# Patient Record
Sex: Female | Born: 1965 | Race: Black or African American | Hispanic: No | Marital: Married | Smoking: Never smoker
Health system: Southern US, Community
[De-identification: ages and names within clinical notes are randomized; demographics above are authoritative.]

## PROBLEM LIST (undated history)

## (undated) DIAGNOSIS — I10 Essential (primary) hypertension: Secondary | ICD-10-CM

## (undated) DIAGNOSIS — E785 Hyperlipidemia, unspecified: Secondary | ICD-10-CM

## (undated) DIAGNOSIS — R7989 Other specified abnormal findings of blood chemistry: Secondary | ICD-10-CM

## (undated) DIAGNOSIS — E039 Hypothyroidism, unspecified: Secondary | ICD-10-CM

## (undated) DIAGNOSIS — N951 Menopausal and female climacteric states: Secondary | ICD-10-CM

## (undated) DIAGNOSIS — E049 Nontoxic goiter, unspecified: Secondary | ICD-10-CM

## (undated) HISTORY — DX: Menopausal and female climacteric states: N95.1

## (undated) HISTORY — PX: OOPHORECTOMY: SHX86

## (undated) HISTORY — DX: Essential (primary) hypertension: I10

## (undated) HISTORY — DX: Hyperlipidemia, unspecified: E78.5

## (undated) HISTORY — DX: Nontoxic goiter, unspecified: E04.9

## (undated) HISTORY — PX: ABDOMINAL HYSTERECTOMY: SHX81

## (undated) HISTORY — DX: Other specified abnormal findings of blood chemistry: R79.89

## (undated) HISTORY — DX: Hypothyroidism, unspecified: E03.9

---

## 1999-07-06 ENCOUNTER — Ambulatory Visit (HOSPITAL_COMMUNITY): Admission: RE | Admit: 1999-07-06 | Discharge: 1999-07-06 | Payer: Self-pay | Admitting: Obstetrics and Gynecology

## 1999-07-06 ENCOUNTER — Encounter: Payer: Self-pay | Admitting: Obstetrics and Gynecology

## 1999-08-16 ENCOUNTER — Other Ambulatory Visit: Admission: RE | Admit: 1999-08-16 | Discharge: 1999-08-16 | Payer: Self-pay | Admitting: Obstetrics and Gynecology

## 1999-09-27 ENCOUNTER — Inpatient Hospital Stay (HOSPITAL_COMMUNITY): Admission: RE | Admit: 1999-09-27 | Discharge: 1999-09-30 | Payer: Self-pay | Admitting: Obstetrics and Gynecology

## 2003-09-06 ENCOUNTER — Encounter: Admission: RE | Admit: 2003-09-06 | Discharge: 2003-09-06 | Payer: Self-pay | Admitting: Internal Medicine

## 2005-07-15 ENCOUNTER — Encounter: Admission: RE | Admit: 2005-07-15 | Discharge: 2005-07-15 | Payer: Self-pay | Admitting: Internal Medicine

## 2005-08-06 ENCOUNTER — Encounter: Admission: RE | Admit: 2005-08-06 | Discharge: 2005-08-06 | Payer: Self-pay | Admitting: Internal Medicine

## 2006-03-31 ENCOUNTER — Encounter: Admission: RE | Admit: 2006-03-31 | Discharge: 2006-03-31 | Payer: Self-pay | Admitting: Internal Medicine

## 2010-05-14 ENCOUNTER — Ambulatory Visit: Payer: Self-pay | Admitting: Internal Medicine

## 2010-05-14 DIAGNOSIS — E049 Nontoxic goiter, unspecified: Secondary | ICD-10-CM | POA: Insufficient documentation

## 2010-05-14 DIAGNOSIS — I10 Essential (primary) hypertension: Secondary | ICD-10-CM

## 2010-05-14 HISTORY — DX: Essential (primary) hypertension: I10

## 2010-05-14 HISTORY — DX: Nontoxic goiter, unspecified: E04.9

## 2010-05-15 LAB — CONVERTED CEMR LAB
ALT: 12 units/L (ref 0–35)
AST: 20 units/L (ref 0–37)
Albumin: 3.9 g/dL (ref 3.5–5.2)
BUN: 11 mg/dL (ref 6–23)
Basophils Relative: 0.5 % (ref 0.0–3.0)
Chloride: 102 meq/L (ref 96–112)
Cholesterol: 207 mg/dL — ABNORMAL HIGH (ref 0–200)
Direct LDL: 159.6 mg/dL
Eosinophils Relative: 2.4 % (ref 0.0–5.0)
Folate: 7.4 ng/mL
GFR calc non Af Amer: 102.43 mL/min (ref >60)
HCT: 34.4 % — ABNORMAL LOW (ref 36.0–46.0)
Hemoglobin: 11.4 g/dL — ABNORMAL LOW (ref 12.0–15.0)
Ketones, ur: NEGATIVE mg/dL
Lymphs Abs: 3 10*3/uL (ref 0.7–4.0)
MCV: 83.7 fL (ref 78.0–100.0)
Monocytes Absolute: 0.6 10*3/uL (ref 0.1–1.0)
Monocytes Relative: 10.2 % (ref 3.0–12.0)
Neutro Abs: 2.4 10*3/uL (ref 1.4–7.7)
Platelets: 259 10*3/uL (ref 150.0–400.0)
Potassium: 3.9 meq/L (ref 3.5–5.1)
RBC: 4.11 M/uL (ref 3.87–5.11)
Sodium: 140 meq/L (ref 135–145)
Specific Gravity, Urine: 1.025 (ref 1.000–1.030)
Total Bilirubin: 0.5 mg/dL (ref 0.3–1.2)
Total CHOL/HDL Ratio: 5
Total Protein: 8.1 g/dL (ref 6.0–8.3)
Transferrin: 300.6 mg/dL (ref 212.0–360.0)
Triglycerides: 88 mg/dL (ref 0.0–149.0)
Urine Glucose: NEGATIVE mg/dL
Urobilinogen, UA: 0.2 (ref 0.0–1.0)
Vitamin B-12: 700 pg/mL (ref 211–911)
WBC: 6.1 10*3/uL (ref 4.5–10.5)
pH: 7 (ref 5.0–8.0)

## 2010-05-18 ENCOUNTER — Encounter: Admission: RE | Admit: 2010-05-18 | Discharge: 2010-05-18 | Payer: Self-pay | Admitting: Internal Medicine

## 2010-05-28 ENCOUNTER — Encounter: Admission: RE | Admit: 2010-05-28 | Discharge: 2010-05-28 | Payer: Self-pay | Admitting: Internal Medicine

## 2010-06-25 ENCOUNTER — Encounter (INDEPENDENT_AMBULATORY_CARE_PROVIDER_SITE_OTHER): Payer: Self-pay | Admitting: *Deleted

## 2010-06-25 ENCOUNTER — Ambulatory Visit: Payer: Self-pay | Admitting: Internal Medicine

## 2010-06-25 DIAGNOSIS — E785 Hyperlipidemia, unspecified: Secondary | ICD-10-CM

## 2010-06-25 DIAGNOSIS — N951 Menopausal and female climacteric states: Secondary | ICD-10-CM

## 2010-06-25 HISTORY — DX: Hyperlipidemia, unspecified: E78.5

## 2010-06-25 HISTORY — DX: Menopausal and female climacteric states: N95.1

## 2010-09-24 ENCOUNTER — Ambulatory Visit: Admit: 2010-09-24 | Payer: Self-pay | Admitting: Internal Medicine

## 2010-09-25 NOTE — Assessment & Plan Note (Signed)
Summary: NEW BCBS PT--# PKG/OFF--STC   Vital Signs:  Patient profile:   45 year old female Height:      69 inches Weight:      236.25 pounds BMI:     35.01 O2 Sat:      99 % on Room air Temp:     97.8 degrees F oral Pulse rate:   65 / minute BP sitting:   170 / 104  (left arm) Cuff size:   large  Vitals Entered By: Zella Ball Ewing CMA Duncan Dull) (26-May-2010 9:44 AM)  O2 Flow:  Room air  Preventive Care Screening     declines tetanus  CC: New Patient , BCBS/RE   CC:  New Patient  and BCBS/RE.  History of Present Illness: here to establish for wellness and co-morbids';  out of BP meds for approx 1 yr, but when tkaing BP was still elevated > 140; Pt denies CP, worsening sob, doe, wheezing, orthopnea, pnd, worsening LE edema, palps, dizziness or syncope  Pt denies new neuro symptoms such as headache, facial or extremity weakness  No fever, wt loss, night sweats, loss of appetite or other constitutional symptoms  Denies polydipsia or polyuria, but about one wk ago did have episode of urinary freq ever 2 rs with relatively large amounts.    Preventive Screening-Counseling & Management  Alcohol-Tobacco     Smoking Status: never      Drug Use:  no.    Problems Prior to Update: None  Medications Prior to Update: 1)  None  Current Medications (verified): 1)  Norvasc 5 Mg Tabs (Amlodipine Besylate) .Marland Kitchen.. 1po Once Daily 2)  Azor 5-20 Mg Tabs (Amlodipine-Olmesartan) .Marland Kitchen.. 1 By Mouth Once Daily  Allergies (verified): No Known Drug Allergies  Past History:  Family History: Last updated: May 26, 2010 mother died with stoke, HTN at 3yo father with ? HTN m-grandmother with ovary cancer m-aunt with breast cancer in her 59's  Social History: Last updated: 26-May-2010 work - [place] for [employer] Married 2 children Never Smoked Alcohol use-yes - social, rare Drug use-no  Risk Factors: Smoking Status: never (2010-05-26)  Past Medical  History: Hypertension  Past Surgical History: Hysterectomy c-section x 2 Oophorectomy - unilateral, left she thinks  Family History: Reviewed history and no changes required. mother died with stoke, HTN at 46yo father with ? HTN m-grandmother with ovary cancer m-aunt with breast cancer in her 14's  Social History: Reviewed history and no changes required. work - [place] for [employer] Married 2 children Never Smoked Alcohol use-yes - social, rare Drug use-no Smoking Status:  never Drug Use:  no  Review of Systems  The patient denies anorexia, fever, weight loss, weight gain, vision loss, decreased hearing, hoarseness, chest pain, syncope, dyspnea on exertion, peripheral edema, prolonged cough, headaches, hemoptysis, abdominal pain, melena, hematochezia, severe indigestion/heartburn, hematuria, muscle weakness, suspicious skin lesions, transient blindness, difficulty walking, depression, unusual weight change, abnormal bleeding, enlarged lymph nodes, and angioedema.         all otherwise negative per pt -    Physical Exam  General:  alert and overweight-appearing.   Head:  normocephalic and atraumatic.   Eyes:  vision grossly intact, pupils equal, and pupils round.   Ears:  R ear normal and L ear normal.   Nose:  no external deformity and no nasal discharge.   Mouth:  no gingival abnormalities and pharynx pink and moist.   Neck:  supple.  with goiter and right  thyroid nodule noted Lungs:  Normal respiratory effort, chest expands symmetrically. Lungs are clear to auscultation, no crackles or wheezes. Heart:  Normal rate and regular rhythm. S1 and S2 normal without gallop, murmur, click, rub or other extra sounds. Abdomen:  Bowel sounds positive,abdomen soft and non-tender without masses, organomegaly or hernias noted. Msk:  No deformity or scoliosis noted of thoracic or lumbar spine.   Extremities:  No clubbing, cyanosis, edema, or deformity noted with normal  full range of motion of all joints.   Neurologic:  No cranial nerve deficits noted. Station and gait are normal. Plantar reflexes are down-going bilaterally. DTRs are symmetrical throughout. Sensory, motor and coordinative functions appear intact. Skin:  color normal and no rashes.   Psych:  not anxious appearing and not depressed appearing.     Impression & Recommendations:  Problem # 1:  Preventive Health Care (ICD-V70.0)  Overall doing well, age appropriate education and counseling updated and referral for appropriate preventive services done unless declined, immunizations up to date or declined, diet counseling done if overweight, urged to quit smoking if smokes , most recent labs reviewed and current ordered if appropriate, ecg reviewed or declined (interpretation per ECG scanned in the EMR if done); information regarding Medicare Prevention requirements given if appropriate; speciality referrals updated as appropriate   Orders: TLB-BMP (Basic Metabolic Panel-BMET) (80048-METABOL) TLB-CBC Platelet - w/Differential (85025-CBCD) TLB-Hepatic/Liver Function Pnl (80076-HEPATIC) TLB-Lipid Panel (80061-LIPID) TLB-TSH (Thyroid Stimulating Hormone) (84443-TSH) TLB-Udip ONLY (81003-UDIP)  Problem # 2:  HYPERTENSION (ICD-401.9)  Her updated medication list for this problem includes:    Norvasc 5 Mg Tabs (Amlodipine besylate) .Marland Kitchen... 1po once daily    Azor 5-20 Mg Tabs (Amlodipine-olmesartan) .Marland Kitchen... 1 by mouth once daily to change to azor, higher strength, f/u 6 wks  BP today: 170/104  Problem # 3:  THYROMEGALY (ICD-240.9) for thyroid u/s - r/o nodule > 1 cm Orders: Radiology Referral (Radiology)  Complete Medication List: 1)  Norvasc 5 Mg Tabs (Amlodipine besylate) .Marland Kitchen.. 1po once daily 2)  Azor 5-20 Mg Tabs (Amlodipine-olmesartan) .Marland Kitchen.. 1 by mouth once daily  Other Orders: Admin 1st Vaccine ([address]) Flu Vaccine 45yrs + [phone])  Patient Instructions: 1)  we will send off for your  previous records 2)  plesae call for your yearly mammogra -  consider [facility] on [address], or [facility] on [address] 3)  Please go to the Lab in the basement for your blood and/or urine tests today  4)  You will be contacted about the referral(s) to: thyroid ultrasound 5)  Please call the number on the [facility] Card for results of your testing  6)  Please schedule a follow-up appointment in 6 weeks Prescriptions: AZOR 5-20 MG TABS (AMLODIPINE-OLMESARTAN) 1 by mouth once daily  #90 x 3   Entered and Authorized by:   Corwin Levins MD   Signed by:   Corwin Levins MD on 05/14/2010   Method used:   Print then Give to Patient   RxID:   [id number]     Flu Vaccine Consent Questions     Do you have a history of severe allergic reactions to this vaccine? no    Any prior history of allergic reactions to egg and/or gelatin? no    Do you have a sensitivity to the preservative Thimersol? no    Do you have a past history of Guillan-Barre Syndrome? no    Do you currently have an acute febrile illness? no    Have you ever had a severe  reaction to latex? no    Vaccine information given and explained to patient? yes    Are you currently pregnant? no    Lot Number:[id number]   Exp Date:02/22/2010   Site Given  Left Deltoid IMlu

## 2010-09-25 NOTE — Letter (Signed)
Summary: Generic Letter  [facility]  [address] [place], [place] [address]   Phone: [phone]  Fax: [phone]    06/25/2010  Stacy Newton [address] [place], [place]  [address]  To Whom it may concern,  Stacy Newton was seen in our office today for an appointment. Please  call our office if you have any questions.           Sincerely,   Dr. Oliver Barre

## 2010-09-25 NOTE — Assessment & Plan Note (Signed)
Summary: 6 WK F/U  #/ CD   Vital Signs:  Patient profile:   45 year old female Height:      69 inches Weight:      244.13 pounds BMI:     36.18 O2 Sat:      97 % on Room air Temp:     98.4 degrees F oral Pulse rate:   82 / minute BP sitting:   162 / 90  (left arm) Cuff size:   large  Vitals Entered By: Zella Ball Ewing CMA (AAMA) (June 25, 2010 9:05 AM)  O2 Flow:  Room air CC: 6 week followup/   CC:  6 week followup/.  History of Present Illness: here for f/u - overall doing ok,  not really trying to follow low chol diet,  good complaince and tolerating meds well;  Pt denies CP, worsening sob, doe, wheezing, orthopnea, pnd, worsening LE edema, palps, dizziness or syncope  Pt denies new neuro symptoms such as headache, facial or extremity weakness   Pt denies polydipsia, polyuria.  Has had ongoing numerous hot flashed in the past 6 months - ? menopausal.  No flushung and No fever, wt loss, night sweats, loss of appetite or other constitutional symptoms.  Denies worsening depressive symptoms, suicidal ideation, or panic.    Problems Prior to Update: 1)  Hot Flashes  (ICD-627.2) 2)  Hyperlipidemia  (ICD-272.4) 3)  Thyromegaly  (ICD-240.9) 4)  Preventive Health Care  (ICD-V70.0) 5)  Hypertension  (ICD-401.9)  Medications Prior to Update: 1)  Norvasc 5 Mg Tabs (Amlodipine Besylate) .Marland Kitchen.. 1po Once Daily 2)  Azor 5-20 Mg Tabs (Amlodipine-Olmesartan) .Marland Kitchen.. 1 By Mouth Once Daily  Current Medications (verified): 1)  Azor 5-40 Mg Tabs (Amlodipine-Olmesartan) .Marland Kitchen.. 1po Once Daily 2)  Aspir-Low 81 Mg Tbec (Aspirin) .Marland Kitchen.. 1po Once Daily  Allergies (verified): No Known Drug Allergies  Past History:  Past Surgical History: Last updated: 05/14/2010 Hysterectomy c-section x 2 Oophorectomy - unilateral, left she thinks  Social History: Last updated: 05/14/2010 work - [place] [employer] for [employer] Married 2 children Never Smoked Alcohol use-yes - social, rare Drug  use-no  Risk Factors: Smoking Status: never (05/14/2010)  Past Medical History: Hypertension Hyperlipidemia  Review of Systems       all otherwise negative per pt -    Physical Exam  General:  alert and overweight-appearing.   Head:  normocephalic and atraumatic.   Eyes:  vision grossly intact, pupils equal, and pupils round.   Ears:  R ear normal and L ear normal.   Nose:  no external deformity and no nasal discharge.   Mouth:  no gingival abnormalities and pharynx pink and moist.   Neck:  supple. no JVD.   Lungs:  Normal respiratory effort, chest expands symmetrically. Lungs are clear to auscultation, no crackles or wheezes. Heart:  Normal rate and regular rhythm. S1 and S2 normal without gallop, murmur, click, rub or other extra sounds. Extremities:  no edema, no erythema    Impression & Recommendations:  Problem # 1:  HYPERTENSION (ICD-401.9)  The following medications were removed from the medication list:    Norvasc 5 Mg Tabs (Amlodipine besylate) .Marland Kitchen... 1po once daily Her updated medication list for this problem includes:    Azor 5-40 Mg Tabs (Amlodipine-olmesartan) .Marland Kitchen... 1po once daily uncontrolled; for increased azor as above, f/u at home and next visit  BP today: 162/90 Prior BP: 170/104 (05/14/2010)  Labs Reviewed: K+: 3.9 (05/14/2010) Creat: : 0.8 (05/14/2010)   Chol:  207 (05/14/2010)   HDL: 40.80 (05/14/2010)   TG: 88.0 (05/14/2010)  Problem # 2:  HYPERLIPIDEMIA (ICD-272.4) Assessment: Unchanged  Labs Reviewed: SGOT: 20 (05/14/2010)   SGPT: 12 (05/14/2010)   HDL:40.80 (05/14/2010)  Chol:207 (05/14/2010)  Trig:88.0 (05/14/2010) d/w pt- .Pt to continue diet efforts, good med tolerance;  - goal LDL less than 100, declines statin at this time  Problem # 3:  HOT FLASHES (ICD-627.2) ? perimenopausal - for [facility] with next lab draw;  if menopausal would be more aggressive with statin tx  Complete Medication List: 1)  Azor 5-40 Mg Tabs (Amlodipine-olmesartan)  .Marland Kitchen.. 1po once daily 2)  Aspir-low 81 Mg Tbec (Aspirin) .Marland Kitchen.. 1po once daily  Patient Instructions: 1)  change the azor to the 5/40 mg - 1 per day 2)  Take an Aspirin every day. - 81 mg - 1 per day - COATED only 3)  Please follow lower cholesterol diet 4)  Please schedule a follow-up appointment in 3 months - Feb 2012 with: 5)  Lipid Panel prior to visit, ICD-9: 272.0 6)  FSH:   627.2 Prescriptions: AZOR 5-40 MG TABS (AMLODIPINE-OLMESARTAN) 1po once daily  #90 x 3   Entered and Authorized by:   Corwin Levins MD   Signed by:   Corwin Levins MD on 06/25/2010   Method used:   Print then Give to Patient   RxID:   [id number]    Orders Added: 1)  Est. Patient Level IV [[address]]

## 2010-10-01 ENCOUNTER — Ambulatory Visit: Payer: Self-pay | Admitting: Internal Medicine

## 2011-07-15 ENCOUNTER — Other Ambulatory Visit: Payer: Self-pay

## 2011-07-15 MED ORDER — AMLODIPINE-OLMESARTAN 5-40 MG PO TABS: 1.0000 | ORAL_TABLET | Freq: Every day | ORAL | Status: DC

## 2011-08-03 IMAGING — US US SOFT TISSUE HEAD/NECK
1 series · 14 of 25 positions shown · non-contrast
Comparison: None.

CLINICAL DATA: Thyroidomegaly on physical exam

THYROID ULTRASOUND
TECHNIQUE: Ultrasound examination of the thyroid gland and adjacent
soft tissues was performed.

[Series 1: us soft tissue head/neck · 0.08mm/px · 14 of 28 slices shown]
[im 1/28]
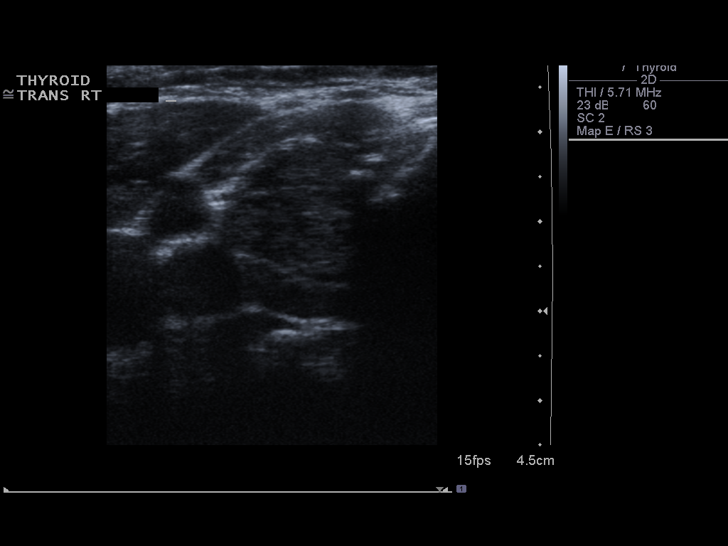
[im 3/28]
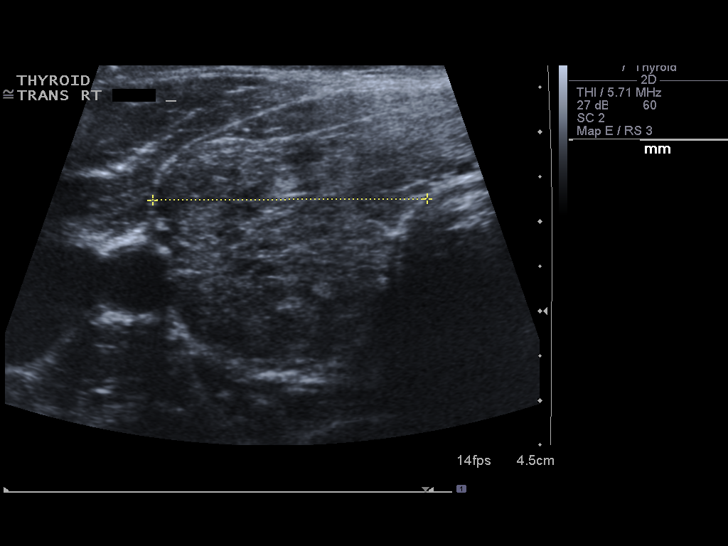
[im 5/28]
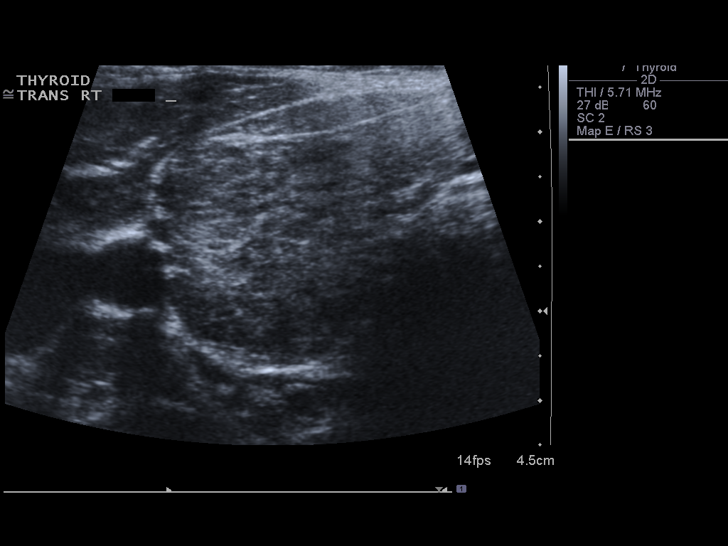
[im 7/28]
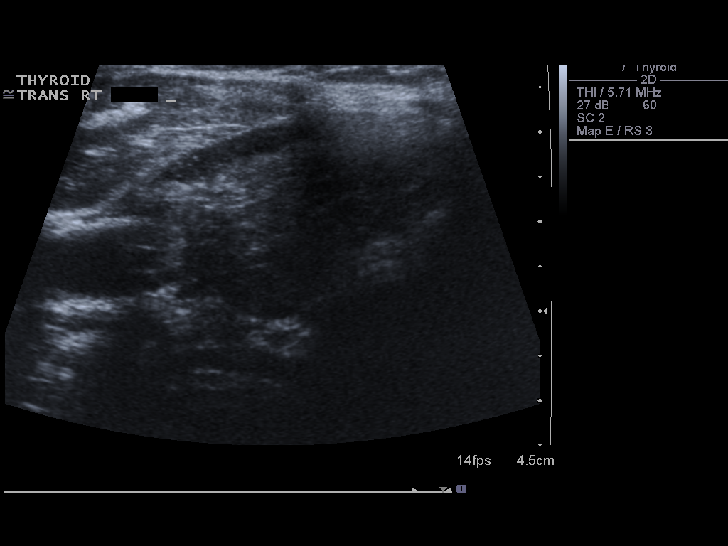
[im 10/28]
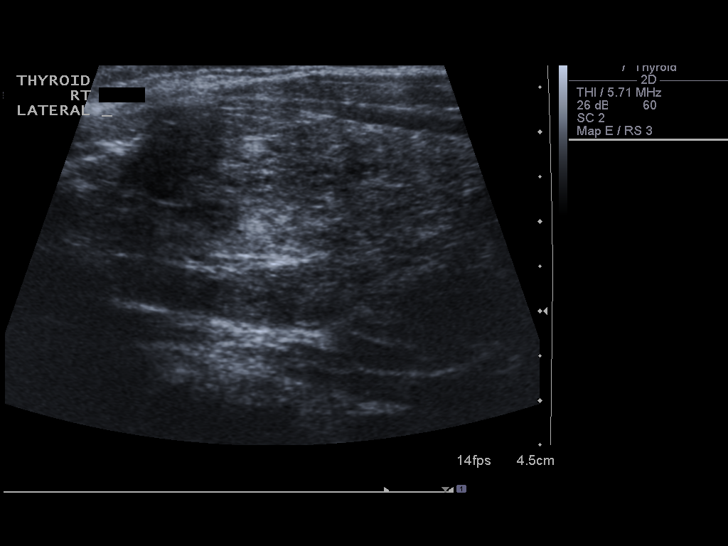
[im 11/28]
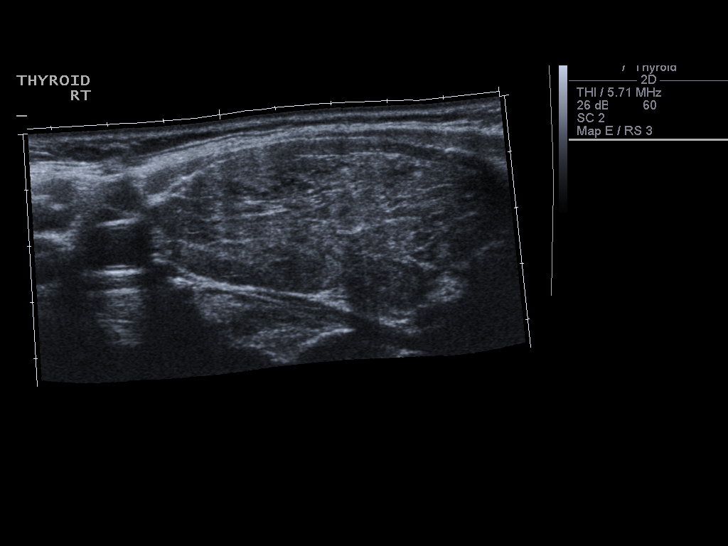
[im 13/28]
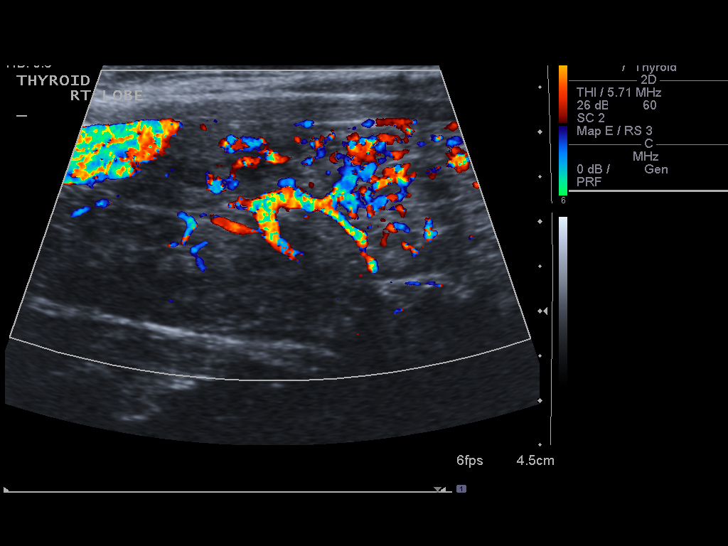
[im 15/28]
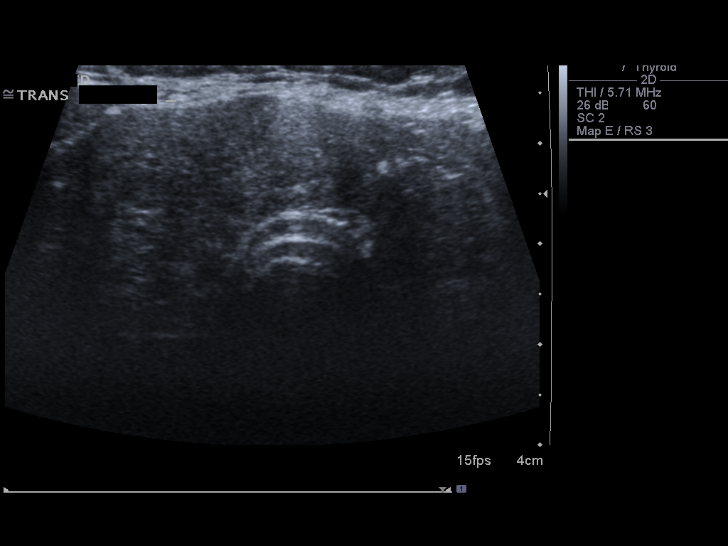
[im 17/28]
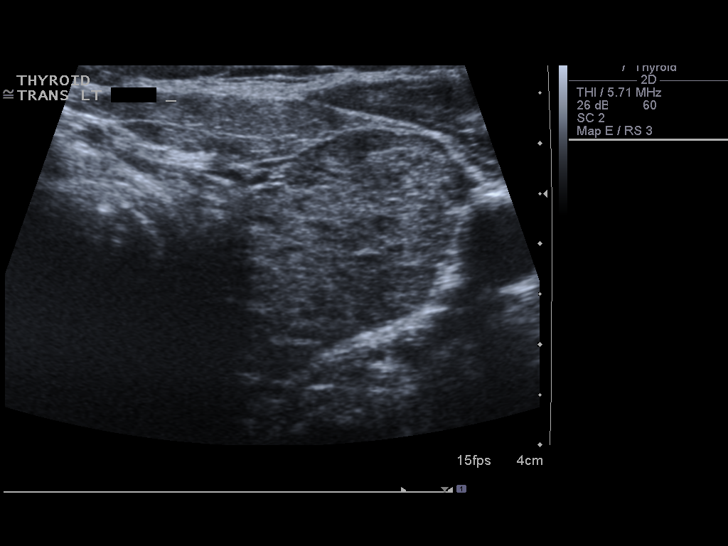
[im 19/28]
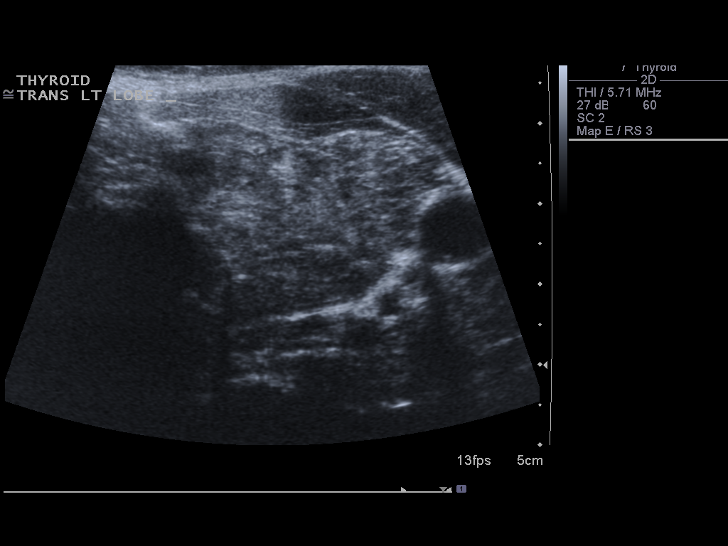
[im 21/28]
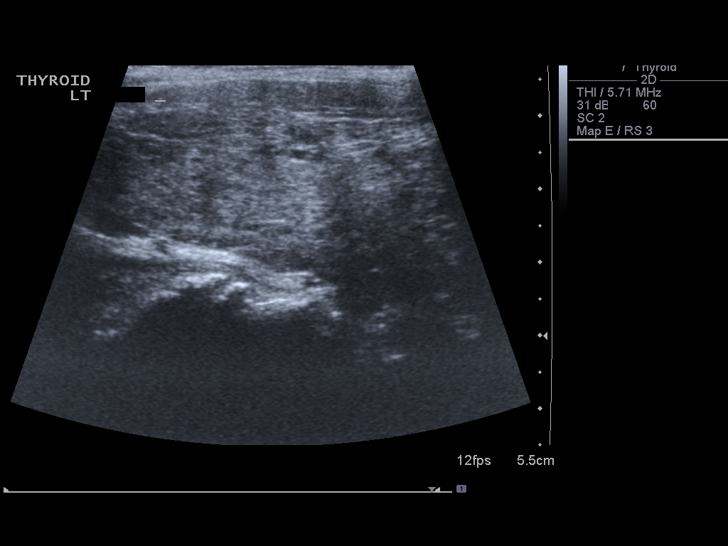
[im 23/28]
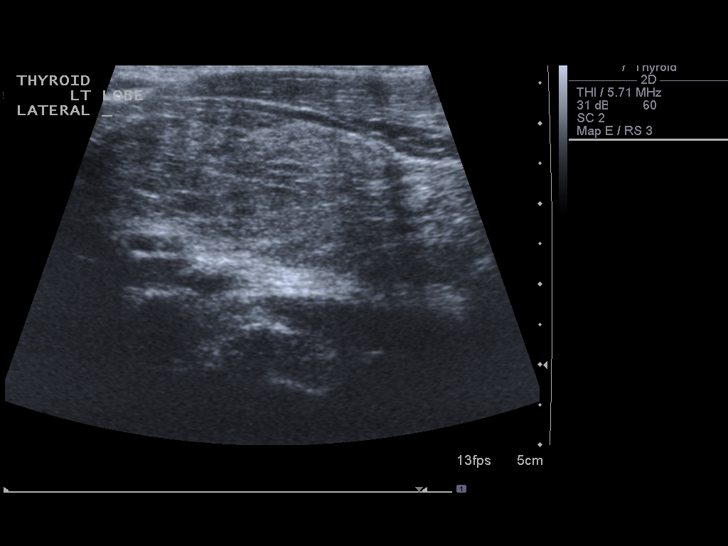
[im 25/28]
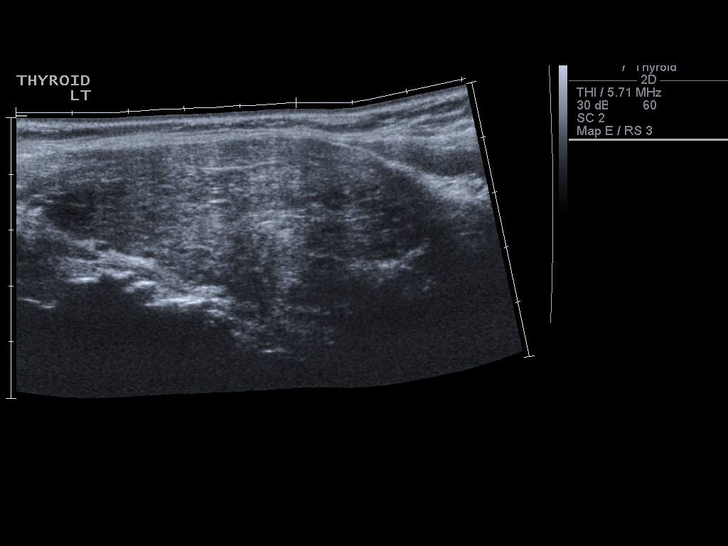
[im 28/28]
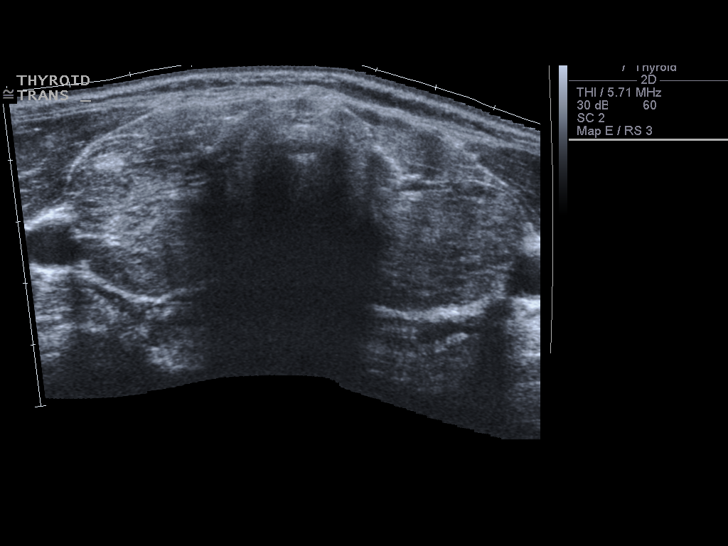

[14 of 25 positions shown; findings below may reference images not displayed]

FINDINGS: Right thyroid lobe:  7.2 x 3.1 x 3.0 cm
Left thyroid lobe:  7.1 x 3.2 x 2.8 cm
Isthmus:  1.1 cm

Focal nodules:  No focal nodule.  The thyroid gland is diffusely
inhomogeneous in echotexture with increased color flow.

Lymphadenopathy:  None visualized.
IMPRESSION: Diffusely enlarged fibroid with increased internal color flow which
may suggest thyroiditis.  No focal nodule.  Correlate with thyroid
function tests.

## 2011-09-03 ENCOUNTER — Telehealth: Payer: Self-pay | Admitting: Internal Medicine

## 2011-09-03 NOTE — Telephone Encounter (Signed)
Last visit oct 2011 - needs OV

## 2011-09-03 NOTE — Telephone Encounter (Signed)
Pt requesting generic blood pressure med/azor 5/40mg --Regular brand too expensive--[facility]--

## 2011-09-04 NOTE — Telephone Encounter (Signed)
Patient informed and did schedule appointment with JWJ.

## 2011-09-06 ENCOUNTER — Ambulatory Visit (INDEPENDENT_AMBULATORY_CARE_PROVIDER_SITE_OTHER): Payer: Federal, State, Local not specified - PPO | Admitting: Internal Medicine

## 2011-09-06 ENCOUNTER — Other Ambulatory Visit (INDEPENDENT_AMBULATORY_CARE_PROVIDER_SITE_OTHER): Payer: Federal, State, Local not specified - PPO

## 2011-09-06 ENCOUNTER — Other Ambulatory Visit: Payer: Self-pay | Admitting: Internal Medicine

## 2011-09-06 VITALS — BP 142/88 | HR 64 | Temp 98.4°F | Wt 236.0 lb

## 2011-09-06 DIAGNOSIS — J019 Acute sinusitis, unspecified: Secondary | ICD-10-CM

## 2011-09-06 DIAGNOSIS — R6889 Other general symptoms and signs: Secondary | ICD-10-CM

## 2011-09-06 DIAGNOSIS — Z0001 Encounter for general adult medical examination with abnormal findings: Secondary | ICD-10-CM | POA: Insufficient documentation

## 2011-09-06 DIAGNOSIS — I10 Essential (primary) hypertension: Secondary | ICD-10-CM

## 2011-09-06 DIAGNOSIS — R7989 Other specified abnormal findings of blood chemistry: Secondary | ICD-10-CM

## 2011-09-06 DIAGNOSIS — Z0000 Encounter for general adult medical examination without abnormal findings: Secondary | ICD-10-CM

## 2011-09-06 DIAGNOSIS — Z23 Encounter for immunization: Secondary | ICD-10-CM

## 2011-09-06 LAB — CBC WITH DIFFERENTIAL/PLATELET
Basophils Absolute: 0 10*3/uL (ref 0.0–0.1)
Eosinophils Absolute: 0.2 10*3/uL (ref 0.0–0.7)
HCT: 34.6 % — ABNORMAL LOW (ref 36.0–46.0)
Hemoglobin: 11.4 g/dL — ABNORMAL LOW (ref 12.0–15.0)
Lymphs Abs: 4 10*3/uL (ref 0.7–4.0)
MCHC: 33 g/dL (ref 30.0–36.0)
MCV: 83.1 fl (ref 78.0–100.0)
Monocytes Absolute: 0.5 10*3/uL (ref 0.1–1.0)
Neutro Abs: 2.6 10*3/uL (ref 1.4–7.7)
RDW: 15.8 % — ABNORMAL HIGH (ref 11.5–14.6)

## 2011-09-06 LAB — URINALYSIS, ROUTINE W REFLEX MICROSCOPIC
Bilirubin Urine: NEGATIVE
Ketones, ur: NEGATIVE
Leukocytes, UA: NEGATIVE
Nitrite: NEGATIVE
Specific Gravity, Urine: >=1.03 (ref 1.000–1.030)
Total Protein, Urine: NEGATIVE
Urine Glucose: NEGATIVE
Urobilinogen, UA: 0.2 (ref 0.0–1.0)
pH: 6 (ref 5.0–8.0)

## 2011-09-06 LAB — HEPATIC FUNCTION PANEL
ALT: 12 U/L (ref 0–35)
AST: 19 U/L (ref 0–37)
Albumin: 3.9 g/dL (ref 3.5–5.2)
Alkaline Phosphatase: 68 U/L (ref 39–117)
Bilirubin, Direct: 0.1 mg/dL (ref 0.0–0.3)
Total Bilirubin: 0.2 mg/dL — ABNORMAL LOW (ref 0.3–1.2)
Total Protein: 8.8 g/dL — ABNORMAL HIGH (ref 6.0–8.3)

## 2011-09-06 LAB — BASIC METABOLIC PANEL
CO2: 30 mEq/L (ref 19–32)
Calcium: 9.3 mg/dL (ref 8.4–10.5)
GFR: 87.46 mL/min (ref >60.00)
Potassium: 3.4 mEq/L — ABNORMAL LOW (ref 3.5–5.1)
Sodium: 141 mEq/L (ref 135–145)

## 2011-09-06 LAB — LIPID PANEL
HDL: 42.1 mg/dL (ref >39.00)
Total CHOL/HDL Ratio: 5
Triglycerides: 90 mg/dL (ref 0.0–149.0)

## 2011-09-06 LAB — TSH: TSH: 9.12 u[IU]/mL — ABNORMAL HIGH (ref 0.35–5.50)

## 2011-09-06 LAB — LDL CHOLESTEROL, DIRECT: Direct LDL: 157.3 mg/dL

## 2011-09-06 MED ORDER — AZITHROMYCIN 250 MG PO TABS: ORAL_TABLET | ORAL | Status: AC

## 2011-09-06 MED ORDER — LISINOPRIL-HYDROCHLOROTHIAZIDE 20-12.5 MG PO TABS: 2.0000 | ORAL_TABLET | Freq: Every day | ORAL | Status: DC

## 2011-09-06 NOTE — Progress Notes (Signed)
Subjective:    Patient ID: Stacy Newton, female    DOB: 1966/03/26, 46 y.o.   MRN: [id number]  HPI  Here for wellness and f/u;  Overall doing ok;  Pt denies CP, worsening SOB, DOE, wheezing, orthopnea, PND, worsening LE edema, palpitations, dizziness or syncope.  Pt denies neurological change such as new Headache, facial or extremity weakness.  Pt denies polydipsia, polyuria, or low sugar symptoms. Pt states overall good compliance with treatment and medications, good tolerability, and trying to follow lower cholesterol diet.  Pt denies worsening depressive symptoms, suicidal ideation or panic. No fever, wt loss, night sweats, loss of appetite, or other constitutional symptoms.  Pt states good ability with ADL's, low fall risk, home safety reviewed and adequate, no significant changes in hearing or vision, and occasionally active with exercise.   Here also with 3 days acute onset fever, facial pain, pressure, general weakness and malaise, and greenish d/c, with slight ST, but [place] to no cough, but has right > left ear fullness and midl hearing decreased.  Has seen Dr Edd Fabian 2011 for sinus concerns but not since. Denies hyper or hypo thyroid symptoms such as voice, skin or hair change.  Past Medical History  Diagnosis Date  . HOT FLASHES 06/25/2010  . HYPERLIPIDEMIA 06/25/2010  . HYPERTENSION 05/14/2010  . THYROMEGALY 05/14/2010  . Abnormal TSH 09/07/2011   Past Surgical History  Procedure Date  . Abdominal hysterectomy   . Cesarean section   . Oophorectomy     unilateral , left she thinks    reports that she has never smoked. She does not have any smokeless tobacco history on file. She reports that she drinks alcohol. She reports that she does not use illicit drugs. family history includes Cancer in her maternal aunt and other and Hypertension in her father. Not on File No current outpatient prescriptions on file prior to visit.     Review of Systems Review of Systems    Constitutional: Negative for diaphoresis, activity change, appetite change and unexpected weight change.  HENT: Negative for hearing loss, ear pain, facial swelling, mouth sores and neck stiffness.   Eyes: Negative for pain, redness and visual disturbance.  Respiratory: Negative for shortness of breath and wheezing.   Cardiovascular: Negative for chest pain and palpitations.  Gastrointestinal: Negative for diarrhea, blood in stool, abdominal distention and rectal pain.  Genitourinary: Negative for hematuria, flank pain and decreased urine volume.  Musculoskeletal: Negative for myalgias and joint swelling.  Skin: Negative for color change and wound.  Neurological: Negative for syncope and numbness.  Hematological: Negative for adenopathy.  Psychiatric/Behavioral: Negative for hallucinations, self-injury, decreased concentration and agitation.      Objective:   Physical Exam BP 142/88  Pulse 64  Temp(Src) 98.4 F (36.9 C) (Oral)  Wt 236 lb (107.049 kg)  SpO2 99% Physical Exam  VS noted, mild ill Constitutional: Pt is oriented to person, place, and time. Appears well-developed and well-nourished.  HENT:  Head: Normocephalic and atraumatic.  Right Ear: External ear normal.  Left Ear: External ear normal.  Nose: Nose normal.  Mouth/Throat: Oropharynx is clear and moist. Bilat tm's mild erythema.  Sinus tender.bilat  Pharynx mild erythema  Eyes: Conjunctivae and EOM are normal. Pupils are equal, round, and reactive to light.  Neck: Normal range of motion. Neck supple. No JVD present. No tracheal deviation present.  Cardiovascular: Normal rate, regular rhythm, normal heart sounds and intact distal pulses.   Pulmonary/Chest: Effort normal and breath sounds normal.  Abdominal:  Soft. Bowel sounds are normal. There is no tenderness.  Musculoskeletal: Normal range of motion. Exhibits no edema.  Lymphadenopathy:  Has no cervical adenopathy.  Neurological: Pt is alert and oriented to  person, place, and time. Pt has normal reflexes. No cranial nerve deficit.  Skin: Skin is warm and dry. No rash noted.  Psychiatric:  Has  normal mood and affect. Behavior is normal.     Assessment & Plan:

## 2011-09-06 NOTE — Assessment & Plan Note (Addendum)
azor 5/40 too expensive; ok to change to lisinoprilHCT asd ;  F/u BP at home and next visit

## 2011-09-06 NOTE — Patient Instructions (Signed)
You had the flu shot today Take all new medications as prescribed  - the antibiotic Continue all other medications as before, except ok to stop the azor, and change to lisinoprilHCT You can also take Delsym OTC for cough, and/or Mucinex (or it's generic off brand) for congestion Please go to LAB in the Basement for the blood and/or urine tests to be done today Please call the phone number [phone] (the PhoneTree System) for results of testing in 2-3 days;  When calling, simply dial the number, and when prompted enter the MRN number above (the Medical Record Number) and the # key, then the message should start. Please return in 1 year for your yearly visit, or sooner if needed, with Lab testing done 3-5 days before

## 2011-09-07 ENCOUNTER — Encounter: Payer: Self-pay | Admitting: Internal Medicine

## 2011-09-07 DIAGNOSIS — R7989 Other specified abnormal findings of blood chemistry: Secondary | ICD-10-CM | POA: Insufficient documentation

## 2011-09-07 HISTORY — DX: Other specified abnormal findings of blood chemistry: R79.89

## 2011-09-07 NOTE — Assessment & Plan Note (Signed)
?   Lab error vs other - for TSH repeat

## 2011-09-07 NOTE — Assessment & Plan Note (Signed)

## 2011-09-07 NOTE — Assessment & Plan Note (Signed)
Mild to mod, for antibx course,  to f/u any worsening symptoms or concerns 

## 2012-06-15 ENCOUNTER — Ambulatory Visit: Payer: Federal, State, Local not specified - PPO | Admitting: Internal Medicine

## 2012-06-18 ENCOUNTER — Encounter: Payer: Self-pay | Admitting: Internal Medicine

## 2012-06-18 ENCOUNTER — Ambulatory Visit (INDEPENDENT_AMBULATORY_CARE_PROVIDER_SITE_OTHER): Payer: Federal, State, Local not specified - PPO | Admitting: Internal Medicine

## 2012-06-18 VITALS — BP 120/78 | HR 67 | Temp 99.2°F | Ht 71.0 in | Wt 239.1 lb

## 2012-06-18 DIAGNOSIS — J019 Acute sinusitis, unspecified: Secondary | ICD-10-CM

## 2012-06-18 DIAGNOSIS — R21 Rash and other nonspecific skin eruption: Secondary | ICD-10-CM | POA: Insufficient documentation

## 2012-06-18 DIAGNOSIS — I10 Essential (primary) hypertension: Secondary | ICD-10-CM

## 2012-06-18 DIAGNOSIS — Z23 Encounter for immunization: Secondary | ICD-10-CM

## 2012-06-18 MED ORDER — AZITHROMYCIN 250 MG PO TABS: ORAL_TABLET | ORAL | Status: DC

## 2012-06-18 MED ORDER — TRIAMCINOLONE ACETONIDE 0.1 % EX CREA: TOPICAL_CREAM | Freq: Two times a day (BID) | CUTANEOUS | Status: DC

## 2012-06-18 MED ORDER — PREDNISONE 10 MG PO TABS: ORAL_TABLET | ORAL | Status: DC

## 2012-06-18 MED ORDER — HYDROCODONE-HOMATROPINE 5-1.5 MG/5ML PO SYRP: 5.0000 mL | ORAL_SOLUTION | Freq: Four times a day (QID) | ORAL | Status: DC | PRN

## 2012-06-18 NOTE — Patient Instructions (Addendum)
You had the flu shot today Take all new medications as prescribed - the cream, low dose prednisone, antibiotic, and cough medicine if needed Continue all other medications as before

## 2012-06-18 NOTE — Progress Notes (Signed)
  Subjective:    Patient ID: Stacy Newton, female    DOB: Mar 27, 1966, 46 y.o.   MRN: [id number]  HPI   Here with 3 days acute onset fever, facial pain, pressure, general weakness and malaise, and greenish d/c, with slight ST, but [place] to no cough and Pt denies chest pain, increased sob or doe, wheezing, orthopnea, PND, increased LE swelling, palpitations, dizziness or syncope.  Also incidentally with bilat medial upper arm rash, itchy without pain, fever, red streaks; just cant stop scratching, no obvious contact allergen.  Due for flu shot today.  Pt denies chest pain, increased sob or doe, wheezing, orthopnea, PND, increased LE swelling, palpitations, dizziness or syncope.   Pt denies polydipsia, polyuria. Past Medical History  Diagnosis Date  . HOT FLASHES 06/25/2010  . HYPERLIPIDEMIA 06/25/2010  . HYPERTENSION 05/14/2010  . THYROMEGALY 05/14/2010  . Abnormal TSH 09/07/2011   Past Surgical History  Procedure Date  . Abdominal hysterectomy   . Cesarean section   . Oophorectomy     unilateral , left she thinks    reports that she has never smoked. She does not have any smokeless tobacco history on file. She reports that she drinks alcohol. She reports that she does not use illicit drugs. family history includes Cancer in her maternal aunt and other and Hypertension in her father. No Known Allergies Current Outpatient Prescriptions on File Prior to Visit  Medication Sig Dispense Refill  . aspirin 81 MG tablet Take 160 mg by mouth daily.      Marland Kitchen lisinopril-hydrochlorothiazide (ZESTORETIC) 20-12.5 MG per tablet Take 2 tablets by mouth daily.  180 tablet  3   Review of Systems  Constitutional: Negative for diaphoresis and unexpected weight change.  HENT: Negative for tinnitus.   Eyes: Negative for photophobia and visual disturbance.  Respiratory: Negative for choking and stridor.   Gastrointestinal: Negative for vomiting and blood in stool.  Genitourinary: Negative for hematuria  and decreased urine volume.  Musculoskeletal: Negative for gait problem.  Skin: Negative for color change and wound.  Neurological: Negative for tremors and numbness.  Psychiatric/Behavioral: Negative for decreased concentration. The patient is not hyperactive.       Objective:   Physical Exam BP 120/78  Pulse 67  Temp 99.2 F (37.3 C) (Oral)  Ht 5\' 11"  (1.803 m)  Wt 239 lb 2 oz (108.466 kg)  BMI 33.35 kg/m2  SpO2 98% Physical Exam  VS noted Constitutional: Pt appears well-developed and well-nourished.  HENT: Head: Normocephalic.  Right Ear: External ear normal.  Left Ear: External ear normal.  Bilat tm's mild erythema.  Sinus tender.  Pharynx mild erythema Eyes: Conjunctivae and EOM are normal. Pupils are equal, round, and reactive to light.  Neck: Normal range of motion. Neck supple.  Cardiovascular: Normal rate and regular rhythm.   Pulmonary/Chest: Effort normal and breath sounds normal.  Neurological: Pt is alert. Not confused  Skin: Skin is warm. No erythema. except for bilat medial arem exocraitions and nontender erythema, wtihout red streaks, swelling Psychiatric: Pt behavior is normal. Thought content normal.     Assessment & Plan:

## 2012-06-18 NOTE — Assessment & Plan Note (Signed)
stable overall by hx and exam, most recent data reviewed with pt, and pt to continue medical treatment as before BP Readings from Last 3 Encounters:  06/18/12 120/78  09/06/11 142/88  06/25/10 162/90

## 2012-06-18 NOTE — Assessment & Plan Note (Signed)
Mild to mod, for antibx course,  to f/u any worsening symptoms or concerns 

## 2012-06-18 NOTE — Assessment & Plan Note (Signed)
?   Contact dermatitis vs other allergic/irritative - for predpack asd, triam cream prn,  to f/u any worsening symptoms or concerns

## 2014-01-14 ENCOUNTER — Ambulatory Visit: Payer: Federal, State, Local not specified - PPO | Admitting: Internal Medicine

## 2014-01-18 ENCOUNTER — Encounter: Payer: Self-pay | Admitting: Internal Medicine

## 2014-01-18 ENCOUNTER — Other Ambulatory Visit (INDEPENDENT_AMBULATORY_CARE_PROVIDER_SITE_OTHER): Payer: Federal, State, Local not specified - PPO

## 2014-01-18 ENCOUNTER — Ambulatory Visit (INDEPENDENT_AMBULATORY_CARE_PROVIDER_SITE_OTHER): Payer: Federal, State, Local not specified - PPO | Admitting: Internal Medicine

## 2014-01-18 VITALS — BP 170/100 | HR 76 | Temp 98.2°F | Ht 69.0 in | Wt 220.0 lb

## 2014-01-18 DIAGNOSIS — Z0000 Encounter for general adult medical examination without abnormal findings: Secondary | ICD-10-CM

## 2014-01-18 DIAGNOSIS — E049 Nontoxic goiter, unspecified: Secondary | ICD-10-CM

## 2014-01-18 DIAGNOSIS — E785 Hyperlipidemia, unspecified: Secondary | ICD-10-CM

## 2014-01-18 DIAGNOSIS — I10 Essential (primary) hypertension: Secondary | ICD-10-CM

## 2014-01-18 DIAGNOSIS — J019 Acute sinusitis, unspecified: Secondary | ICD-10-CM

## 2014-01-18 LAB — CBC WITH DIFFERENTIAL/PLATELET
BASOS PCT: 0.5 % (ref 0.0–3.0)
Basophils Absolute: 0 10*3/uL (ref 0.0–0.1)
EOS PCT: 1.6 % (ref 0.0–5.0)
Eosinophils Absolute: 0.1 10*3/uL (ref 0.0–0.7)
HCT: 34.8 % — ABNORMAL LOW (ref 36.0–46.0)
Hemoglobin: 11.2 g/dL — ABNORMAL LOW (ref 12.0–15.0)
Lymphocytes Relative: 47.3 % — ABNORMAL HIGH (ref 12.0–46.0)
Lymphs Abs: 3.5 10*3/uL (ref 0.7–4.0)
MCHC: 32.3 g/dL (ref 30.0–36.0)
MCV: 82.4 fl (ref 78.0–100.0)
MONOS PCT: 7.7 % (ref 3.0–12.0)
Monocytes Absolute: 0.6 10*3/uL (ref 0.1–1.0)
NEUTROS PCT: 42.9 % — AB (ref 43.0–77.0)
Neutro Abs: 3.2 10*3/uL (ref 1.4–7.7)
Platelets: 299 10*3/uL (ref 150.0–400.0)
RBC: 4.22 Mil/uL (ref 3.87–5.11)
RDW: 15.8 % — ABNORMAL HIGH (ref 11.5–15.5)
WBC: 7.4 10*3/uL (ref 4.0–10.5)

## 2014-01-18 LAB — T4, FREE: Free T4: 0.52 ng/dL — ABNORMAL LOW (ref 0.60–1.60)

## 2014-01-18 LAB — HEPATIC FUNCTION PANEL
ALBUMIN: 3.5 g/dL (ref 3.5–5.2)
ALT: 13 U/L (ref 0–35)
AST: 19 U/L (ref 0–37)
Alkaline Phosphatase: 62 U/L (ref 39–117)
BILIRUBIN TOTAL: 0.5 mg/dL (ref 0.2–1.2)
Bilirubin, Direct: 0.1 mg/dL (ref 0.0–0.3)
Total Protein: 8.9 g/dL — ABNORMAL HIGH (ref 6.0–8.3)

## 2014-01-18 LAB — BASIC METABOLIC PANEL
BUN: 8 mg/dL (ref 6–23)
CO2: 28 mEq/L (ref 19–32)
CREATININE: 0.7 mg/dL (ref 0.4–1.2)
Calcium: 9.4 mg/dL (ref 8.4–10.5)
Chloride: 102 mEq/L (ref 96–112)
GFR: 112.39 mL/min (ref >60.00)
Glucose, Bld: 86 mg/dL (ref 70–99)
Potassium: 4 mEq/L (ref 3.5–5.1)
Sodium: 140 mEq/L (ref 135–145)

## 2014-01-18 LAB — URINALYSIS, ROUTINE W REFLEX MICROSCOPIC
BILIRUBIN URINE: NEGATIVE
Ketones, ur: NEGATIVE
LEUKOCYTES UA: NEGATIVE
NITRITE: NEGATIVE
PH: 6.5 (ref 5.0–8.0)
SPECIFIC GRAVITY, URINE: 1.02 (ref 1.000–1.030)
TOTAL PROTEIN, URINE-UPE24: 30 — AB
Urine Glucose: NEGATIVE
Urobilinogen, UA: 0.2 (ref 0.0–1.0)

## 2014-01-18 LAB — LIPID PANEL
CHOL/HDL RATIO: 5
CHOLESTEROL: 167 mg/dL (ref 0–200)
HDL: 36.4 mg/dL — ABNORMAL LOW (ref >39.00)
LDL CALC: 117 mg/dL — AB (ref 0–99)
TRIGLYCERIDES: 66 mg/dL (ref 0.0–149.0)
VLDL: 13.2 mg/dL (ref 0.0–40.0)

## 2014-01-18 LAB — TSH: TSH: 30.7 u[IU]/mL — ABNORMAL HIGH (ref 0.35–4.50)

## 2014-01-18 MED ORDER — LEVOFLOXACIN 500 MG PO TABS: 500.0000 mg | ORAL_TABLET | Freq: Every day | ORAL | Status: DC

## 2014-01-18 MED ORDER — FEXOFENADINE HCL 180 MG PO TABS
180.0000 mg | ORAL_TABLET | Freq: Every day | ORAL | Status: DC
Start: 2014-01-18 — End: 2014-07-26

## 2014-01-18 NOTE — Assessment & Plan Note (Signed)
Also for lower chol diet

## 2014-01-18 NOTE — Assessment & Plan Note (Signed)

## 2014-01-18 NOTE — Assessment & Plan Note (Signed)
Mild to mod, for antibx course,  to f/u any worsening symptoms or concerns 

## 2014-01-18 NOTE — Assessment & Plan Note (Signed)
Overall stable, to re-start med,  to f/u any worsening symptoms or concerns

## 2014-01-18 NOTE — Assessment & Plan Note (Signed)
For free t4 and thyroid u/s, also refer endo

## 2014-01-18 NOTE — Progress Notes (Signed)
Pre visit review using our clinic review tool, if applicable. No additional management support is needed unless otherwise documented below in the visit note. 

## 2014-01-18 NOTE — Patient Instructions (Signed)
Please take all new medication as prescribed  - the antibiotics, the allegra  You can also take Delsym OTC for cough, and/or Mucinex (or it's generic off brand) for congestion, and tylenol as needed for pain.  Please continue all other medications as before, and refills have been done if requested - including to re-start the lisinopril HCT  Please have the pharmacy call with any other refills you may need.  Please continue your efforts at being more active, low cholesterol diet, and weight control.  You are otherwise up to date with prevention measures today.  You will be contacted regarding the referral for: thyroid ultrasound, and endocrinology  Please go to the LAB in the Basement (turn left off the elevator) for the tests to be done today  You will be contacted by phone if any changes need to be made immediately.  Otherwise, you will receive a letter about your results with an explanation, but please check with MyChart first.  Please remember to sign up for MyChart if you have not done so, as this will be important to you in the future with finding out test results, communicating by private email, and scheduling acute appointments online when needed.  Please return in 6 months, or sooner if needed

## 2014-01-18 NOTE — Progress Notes (Signed)
Subjective:    Patient ID: Stacy Newton, female    DOB: May 14, 1966, 48 y.o.   MRN: [id number]  HPI  Here for wellness and f/u;  Overall doing ok;  Pt denies CP, worsening SOB, DOE, wheezing, orthopnea, PND, worsening LE edema, palpitations, dizziness or syncope.  Pt denies neurological change such as new headache, facial or extremity weakness.  Pt denies polydipsia, polyuria, or low sugar symptoms. Pt states overall good compliance with treatment and medications, good tolerability, and has been trying to follow lower cholesterol diet.  Pt denies worsening depressive symptoms, suicidal ideation or panic. No fever, night sweats, wt loss, loss of appetite, or other constitutional symptoms.  Pt states good ability with ADL's, has low fall risk, home safety reviewed and adequate, no other significant changes in hearing or vision, and only occasionally active with exercise.  Also  Here with 2-3 days acute onset fever, facial pain, pressure, headache, general weakness and malaise, and greenish d/c, with mild ST and cough, but pt denies chest pain, wheezing, increased sob or doe, orthopnea, PND, increased LE swelling, palpitations, dizziness or syncope. Also mentions increased size over months of right > left thyroid, no pain. Denies hyper or hypo thyroid symptoms such as voice, skin or hair change.  Last TSH mild elev 2013, has been lost to f/u since then.   Past Medical History  Diagnosis Date  . HOT FLASHES 06/25/2010  . HYPERLIPIDEMIA 06/25/2010  . HYPERTENSION 05/14/2010  . THYROMEGALY 05/14/2010  . Abnormal TSH 09/07/2011   Past Surgical History  Procedure Laterality Date  . Abdominal hysterectomy    . Cesarean section    . Oophorectomy      unilateral , left she thinks    reports that she has never smoked. She does not have any smokeless tobacco history on file. She reports that she drinks alcohol. She reports that she does not use illicit drugs. family history includes Cancer in her  maternal aunt and other; Hypertension in her father. No Known Allergies Current Outpatient Prescriptions on File Prior to Visit  Medication Sig Dispense Refill  . aspirin 81 MG tablet Take 160 mg by mouth daily.      Marland Kitchen lisinopril-hydrochlorothiazide (ZESTORETIC) 20-12.5 MG per tablet Take 2 tablets by mouth daily.  180 tablet  3  . triamcinolone cream (KENALOG) 0.1 % Apply topically 2 (two) times daily.  30 g  0   No current facility-administered medications on file prior to visit.   Review of Systems Constitutional: Negative for increased diaphoresis, other activity, appetite or other siginficant weight change  HENT: Negative for worsening hearing loss, ear pain, facial swelling, mouth sores and neck stiffness.   Eyes: Negative for other worsening pain, redness or visual disturbance.  Respiratory: Negative for shortness of breath and wheezing.   Cardiovascular: Negative for chest pain and palpitations.  Gastrointestinal: Negative for diarrhea, blood in stool, abdominal distention or other pain Genitourinary: Negative for hematuria, flank pain or change in urine volume.  Musculoskeletal: Negative for myalgias or other joint complaints.  Skin: Negative for color change and wound.  Neurological: Negative for syncope and numbness. other than noted Hematological: Negative for adenopathy. or other swelling Psychiatric/Behavioral: Negative for hallucinations, self-injury, decreased concentration or other worsening agitation.      Objective:   Physical Exam BP 170/100  Pulse 76  Temp(Src) 98.2 F (36.8 C) (Oral)  Ht 5\' 9"  (1.753 m)  Wt 220 lb (99.791 kg)  BMI 32.47 kg/m2  SpO2 99% VS noted,  Constitutional: Pt is oriented to person, place, and time. Appears well-developed and well-nourished.  Head: Normocephalic and atraumatic.  Right Ear: External ear normal.  Left Ear: External ear normal.  Nose: Nose normal.  Mouth/Throat: Oropharynx is clear and moist.  Eyes: Conjunctivae and  EOM are normal. Pupils are equal, round, and reactive to light.  Neck: Normal range of motion. Neck supple. No JVD present. No tracheal deviation present. but thyroid marked enlarged right > left 1-2+ Cardiovascular: Normal rate, regular rhythm, normal heart sounds and intact distal pulses.   Pulmonary/Chest: Effort normal and breath sounds without rales or wheezing  Abdominal: Soft. Bowel sounds are normal. NT. No HSM  Musculoskeletal: Normal range of motion. Exhibits no edema.  Lymphadenopathy:  Has no cervical adenopathy.  Neurological: Pt is alert and oriented to person, place, and time. Pt has normal reflexes. No cranial nerve deficit. Motor grossly intact Skin: Skin is warm and dry. No rash noted.  Psychiatric:  Has mild nervous mood and affect. Behavior is normal.     Assessment & Plan:

## 2014-01-19 ENCOUNTER — Telehealth: Payer: Self-pay | Admitting: Internal Medicine

## 2014-01-19 DIAGNOSIS — I10 Essential (primary) hypertension: Secondary | ICD-10-CM

## 2014-01-19 MED ORDER — LISINOPRIL-HYDROCHLOROTHIAZIDE 20-12.5 MG PO TABS: 2.0000 | ORAL_TABLET | Freq: Every day | ORAL | Status: DC

## 2014-01-19 NOTE — Telephone Encounter (Signed)
Pt request refill for her BP to be send to [facility]. Pt stated she was here yesterday but she didn't get that call in.

## 2014-01-19 NOTE — Telephone Encounter (Signed)
Called the patient left a detailed message script sent in to [facility].

## 2014-01-19 NOTE — Telephone Encounter (Signed)
Relevant patient education assigned to patient using Emmi. ° °

## 2014-01-20 ENCOUNTER — Encounter: Payer: Self-pay | Admitting: Internal Medicine

## 2014-01-20 ENCOUNTER — Other Ambulatory Visit: Payer: Self-pay | Admitting: Internal Medicine

## 2014-01-20 DIAGNOSIS — E039 Hypothyroidism, unspecified: Secondary | ICD-10-CM

## 2014-01-20 HISTORY — DX: Hypothyroidism, unspecified: E03.9

## 2014-01-20 MED ORDER — LEVOTHYROXINE SODIUM 50 MCG PO TABS: 50.0000 ug | ORAL_TABLET | Freq: Every day | ORAL | Status: DC

## 2014-02-02 ENCOUNTER — Ambulatory Visit
Admission: RE | Admit: 2014-02-02 | Discharge: 2014-02-02 | Disposition: A | Discharge status: Home / Self Care | Payer: Federal, State, Local not specified - PPO | Source: Ambulatory Visit | Attending: Internal Medicine | Admitting: Internal Medicine

## 2014-02-02 DIAGNOSIS — E049 Nontoxic goiter, unspecified: Secondary | ICD-10-CM

## 2014-02-03 ENCOUNTER — Encounter: Payer: Self-pay | Admitting: Internal Medicine

## 2014-02-03 ENCOUNTER — Ambulatory Visit (INDEPENDENT_AMBULATORY_CARE_PROVIDER_SITE_OTHER): Payer: Federal, State, Local not specified - PPO | Admitting: Endocrinology

## 2014-02-03 ENCOUNTER — Encounter: Payer: Self-pay | Admitting: Endocrinology

## 2014-02-03 VITALS — BP 138/98 | HR 92 | Temp 98.7°F | Ht 69.0 in | Wt 218.0 lb

## 2014-02-03 DIAGNOSIS — E039 Hypothyroidism, unspecified: Secondary | ICD-10-CM

## 2014-02-03 MED ORDER — LEVOTHYROXINE SODIUM 100 MCG PO TABS: 100.0000 ug | ORAL_TABLET | Freq: Every day | ORAL | Status: DC

## 2014-02-03 NOTE — Patient Instructions (Signed)
i have sent a prescription to your pharmacy, to double the thyroid pill. Please repeat the blood test in 1 month.  Please come back for a follow-up appointment in 6 months.        Hypothyroidism The thyroid is a large gland located in the lower front of your neck. The thyroid gland helps control metabolism. Metabolism is how your body handles food. It controls metabolism with the hormone thyroxine. When this gland is underactive (hypothyroid), it produces too Wes[place] hormone.  CAUSES These include:   Absence or destruction of thyroid tissue.  Goiter due to iodine deficiency.  Goiter due to medications.  Congenital defects (since birth).  Problems with the pituitary. This causes a lack of TSH (thyroid stimulating hormone). This hormone tells the thyroid to turn out more hormone. SYMPTOMS  Lethargy (feeling as though you have no energy)  Cold intolerance  Weight gain (in spite of normal food intake)  Dry skin  Coarse hair  Menstrual irregularity (if severe, may lead to infertility)  Slowing of thought processes Cardiac problems are also caused by insufficient amounts of thyroid hormone. Hypothyroidism in the newborn is cretinism, and is an extreme form. It is important that this form be treated adequately and immediately or it will lead rapidly to retarded physical and mental development. DIAGNOSIS  To prove hypothyroidism, your caregiver may do blood tests and ultrasound tests. Sometimes the signs are hidden. It may be necessary for your caregiver to watch this illness with blood tests either before or after diagnosis and treatment. TREATMENT  Low levels of thyroid hormone are increased by using synthetic thyroid hormone. This is a safe, effective treatment. It usually takes about four weeks to gain the full effects of the medication. After you have the full effect of the medication, it will generally take another four weeks for problems to leave. Your caregiver may start you  on low doses. If you have had heart problems the dose may be gradually increased. It is generally not an emergency to get rapidly to normal. HOME CARE INSTRUCTIONS   Take your medications as your caregiver suggests. Let your caregiver know of any medications you are taking or start taking. Your caregiver will help you with dosage schedules.  As your condition improves, your dosage needs may increase. It will be necessary to have continuing blood tests as suggested by your caregiver.  Report all suspected medication side effects to your caregiver. SEEK MEDICAL CARE IF: Seek medical care if you develop:  Sweating.  Tremulousness (tremors).  Anxiety.  Rapid weight loss.  Heat intolerance.  Emotional swings.  Diarrhea.  Weakness. SEEK IMMEDIATE MEDICAL CARE IF:  You develop chest pain, an irregular heart beat (palpitations), or a rapid heart beat. MAKE SURE YOU:   Understand these instructions.  Will watch your condition.  Will get help right away if you are not doing well or get worse. Document Released: 08/12/2005 Document Revised: 11/04/2011 Document Reviewed: 04/01/2008 [facility] Patient Information 2014 [place], [place].

## 2014-02-03 NOTE — Progress Notes (Signed)
Subjective:    Patient ID: Stacy Newton, female    DOB: January 25, 1966, 48 y.o.   MRN: [id number]  HPI In 1984, pt noted slight swelling at the anterior neck, but no assoc pain.  However, she says she never needed medication until last month.  Since on the synthroid, she says the neck swelling is a bit less.  she has never taken kelp or any other type of non-prescribed thyroid product.  she has never had thyroid surgery, or XRT to the neck.  He has never been on amiodarone or lithium.   Past Medical History  Diagnosis Date  . HOT FLASHES 06/25/2010  . HYPERLIPIDEMIA 06/25/2010  . HYPERTENSION 05/14/2010  . THYROMEGALY 05/14/2010  . Abnormal TSH 09/07/2011  . Unspecified hypothyroidism 01/20/2014    Past Surgical History  Procedure Laterality Date  . Abdominal hysterectomy    . Cesarean section    . Oophorectomy      unilateral , left she thinks    History   Social History  . Marital Status: Single    Spouse Name: N/A    Number of Children: 2  . 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 Smoking status: Never Smoker   . Smokeless tobacco: Not on file  . Alcohol Use: Yes     Comment: social, rare  . Drug Use: No  . Sexual Activity: Not on file   Other Topics Concern  . Not on file   Social History Narrative  . No narrative on file    Current Outpatient Prescriptions on File Prior to Visit  Medication Sig Dispense Refill  . aspirin 81 MG tablet Take 160 mg by mouth daily.      . fexofenadine (ALLEGRA) 180 MG tablet Take 1 tablet (180 mg total) by mouth daily.  30 tablet  2  . lisinopril-hydrochlorothiazide (ZESTORETIC) 20-12.5 MG per tablet Take 2 tablets by mouth daily.  180 tablet  3  . levofloxacin (LEVAQUIN) 500 MG tablet Take 1 tablet (500 mg total) by mouth daily.  10 tablet  0  . triamcinolone cream (KENALOG) 0.1 % Apply topically 2 (two) times daily.  30 g  0   No current  facility-administered medications on file prior to visit.    No Known Allergies  Family History  Problem Relation Age of Onset  . Hypertension Father   . Cancer Maternal Aunt     breast  . Cancer Other     ovary  . Thyroid disease Neg Hx     BP 138/98  Pulse 92  Temp(Src) 98.7 F (37.1 C) (Oral)  Ht 5\' 9"  (1.753 m)  Wt 218 lb (98.884 kg)  BMI 32.18 kg/m2  SpO2 97%   Review of Systems denies depression, muscle cramps, sob, constipation, numbness, blurry vision, cold intolerance, myalgias, dry skin, rhinorrhea, easy bruising, and syncope.  She has lost a few lbs, since on the synthroid.  She has hair loss.      Objective:   Physical Exam VS: see vs page GEN: no distress HEAD: head: no deformity eyes: no periorbital swelling, no proptosis external nose and ears are normal mouth: no lesion seen NECK: supple, thyroid is 10x normal size, diffuse.  No palpable nodule. CHEST WALL: no deformity LUNGS:  Clear to auscultation CV: reg rate and rhythm, no murmur. ABD: abdomen is soft, nontender.  no hepatosplenomegaly.  not distended.  no hernia.  MUSCULOSKELETAL: muscle  bulk and strength are grossly normal.  no obvious joint swelling.  gait is normal and steady EXTEMITIES: no deformity.  no edema PULSES: no carotid bruit NEURO:  cn 2-12 grossly intact.   readily moves all 4's.  sensation is intact to touch on all 4's SKIN:  Normal texture and temperature.  No rash or suspicious lesion is visible.   NODES:  None palpable at the neck PSYCH: alert, well-oriented.  Does not appear anxious nor depressed.   Lab Results  Component Value Date   TSH 30.70* 01/18/2014   (i reviewed [place] report).      Assessment & Plan:  Hypothyroidism: moderate exacerbation. Goiter: new to me.  Although it is large, it is simple.      Patient is advised the following: Patient Instructions  i have sent a prescription to your pharmacy, to double the thyroid pill. Please repeat the blood test in  1 month.  Please come back for a follow-up appointment in 6 months.        Hypothyroidism The thyroid is a large gland located in the lower front of your neck. The thyroid gland helps control metabolism. Metabolism is how your body handles food. It controls metabolism with the hormone thyroxine. When this gland is underactive (hypothyroid), it produces too [place] hormone.  CAUSES These include:   Absence or destruction of thyroid tissue.  Goiter due to iodine deficiency.  Goiter due to medications.  Congenital defects (since birth).  Problems with the pituitary. This causes a lack of TSH (thyroid stimulating hormone). This hormone tells the thyroid to turn out more hormone. SYMPTOMS  Lethargy (feeling as though you have no energy)  Cold intolerance  Weight gain (in spite of normal food intake)  Dry skin  Coarse hair  Menstrual irregularity (if severe, may lead to infertility)  Slowing of thought processes Cardiac problems are also caused by insufficient amounts of thyroid hormone. Hypothyroidism in the newborn is cretinism, and is an extreme form. It is important that this form be treated adequately and immediately or it will lead rapidly to retarded physical and mental development. DIAGNOSIS  To prove hypothyroidism, your caregiver may do blood tests and ultrasound tests. Sometimes the signs are hidden. It may be necessary for your caregiver to watch this illness with blood tests either before or after diagnosis and treatment. TREATMENT  Low levels of thyroid hormone are increased by using synthetic thyroid hormone. This is a safe, effective treatment. It usually takes about four weeks to gain the full effects of the medication. After you have the full effect of the medication, it will generally take another four weeks for problems to leave. Your caregiver may start you on low doses. If you have had heart problems the dose may be gradually increased. It is generally not an  emergency to get rapidly to normal. HOME CARE INSTRUCTIONS   Take your medications as your caregiver suggests. Let your caregiver know of any medications you are taking or start taking. Your caregiver will help you with dosage schedules.  As your condition improves, your dosage needs may increase. It will be necessary to have continuing blood tests as suggested by your caregiver.  Report all suspected medication side effects to your caregiver. SEEK MEDICAL CARE IF: Seek medical care if you develop:  Sweating.  Tremulousness (tremors).  Anxiety.  Rapid weight loss.  Heat intolerance.  Emotional swings.  Diarrhea.  Weakness. SEEK IMMEDIATE MEDICAL CARE IF:  You develop chest pain, an irregular heart beat (palpitations),  or a rapid heart beat. MAKE SURE YOU:   Understand these instructions.  Will watch your condition.  Will get help right away if you are not doing well or get worse. Document Released: 08/12/2005 Document Revised: 11/04/2011 Document Reviewed: 04/01/2008 [facility] Patient Information 2014 [place]

## 2014-02-08 ENCOUNTER — Other Ambulatory Visit: Payer: Federal, State, Local not specified - PPO

## 2014-07-26 ENCOUNTER — Other Ambulatory Visit (INDEPENDENT_AMBULATORY_CARE_PROVIDER_SITE_OTHER): Payer: Federal, State, Local not specified - PPO

## 2014-07-26 ENCOUNTER — Ambulatory Visit (INDEPENDENT_AMBULATORY_CARE_PROVIDER_SITE_OTHER): Payer: Federal, State, Local not specified - PPO | Admitting: Internal Medicine

## 2014-07-26 ENCOUNTER — Encounter: Payer: Self-pay | Admitting: Internal Medicine

## 2014-07-26 VITALS — BP 118/80 | HR 82 | Temp 98.6°F | Ht 71.0 in | Wt 214.4 lb

## 2014-07-26 DIAGNOSIS — I10 Essential (primary) hypertension: Secondary | ICD-10-CM

## 2014-07-26 DIAGNOSIS — Z0189 Encounter for other specified special examinations: Secondary | ICD-10-CM

## 2014-07-26 DIAGNOSIS — E785 Hyperlipidemia, unspecified: Secondary | ICD-10-CM

## 2014-07-26 DIAGNOSIS — J209 Acute bronchitis, unspecified: Secondary | ICD-10-CM

## 2014-07-26 DIAGNOSIS — Z0000 Encounter for general adult medical examination without abnormal findings: Secondary | ICD-10-CM

## 2014-07-26 DIAGNOSIS — E038 Other specified hypothyroidism: Secondary | ICD-10-CM

## 2014-07-26 DIAGNOSIS — J019 Acute sinusitis, unspecified: Secondary | ICD-10-CM | POA: Insufficient documentation

## 2014-07-26 DIAGNOSIS — E039 Hypothyroidism, unspecified: Secondary | ICD-10-CM

## 2014-07-26 LAB — URINALYSIS, ROUTINE W REFLEX MICROSCOPIC
BILIRUBIN URINE: NEGATIVE
Ketones, ur: NEGATIVE
NITRITE: NEGATIVE
Specific Gravity, Urine: 1.015 (ref 1.000–1.030)
TOTAL PROTEIN, URINE-UPE24: NEGATIVE
Urine Glucose: NEGATIVE
Urobilinogen, UA: 0.2 (ref 0.0–1.0)
pH: 7 (ref 5.0–8.0)

## 2014-07-26 LAB — TSH: TSH: 12.93 u[IU]/mL — ABNORMAL HIGH (ref 0.35–4.50)

## 2014-07-26 MED ORDER — FEXOFENADINE HCL 180 MG PO TABS: 180.0000 mg | ORAL_TABLET | Freq: Every day | ORAL | Status: DC

## 2014-07-26 MED ORDER — HYDROCODONE-HOMATROPINE 5-1.5 MG/5ML PO SYRP: 5.0000 mL | ORAL_SOLUTION | Freq: Four times a day (QID) | ORAL | Status: DC | PRN

## 2014-07-26 NOTE — Progress Notes (Signed)
Subjective:    Patient ID: Stacy Newton, female    DOB: 05/31/1966, 48 y.o.   MRN: [id number]  HPI  Here to f/u; overall doing ok,  Pt denies chest pain, increased sob or doe, wheezing, orthopnea, PND, increased LE swelling, palpitations, dizziness or syncope.  Pt denies polydipsia, polyuria, or low sugar symptoms such as weakness or confusion improved with po intake.  Pt denies new neurological symptoms such as new headache, or facial or extremity weakness or numbness.   Pt states overall good compliance with meds, has been trying to follow lower cholesterol diet, with wt overall stable,  but [place] exercise however. States not taking thyroid med on days she has not eaten bfast since this was on the instructions.but has taken regularly now for several wks.Denies hyper or hypo thyroid symptoms such as voice, skin or hair change. Denies urinary symptoms such as dysuria, frequency, urgency, flank pain, hematuria or n/v, fever, chills. Has has nonprod cough for 1 wk, no fever or production, no sob/cp, hard to sleep with cough at night Past Medical History  Diagnosis Date  . HOT FLASHES 06/25/2010  . HYPERLIPIDEMIA 06/25/2010  . HYPERTENSION 05/14/2010  . THYROMEGALY 05/14/2010  . Abnormal TSH 09/07/2011  . Unspecified hypothyroidism 01/20/2014   Past Surgical History  Procedure Laterality Date  . Abdominal hysterectomy    . Cesarean section    . Oophorectomy      unilateral , left she thinks    reports that she has never smoked. She does not have any smokeless tobacco history on file. She reports that she drinks alcohol. She reports that she does not use illicit drugs. family history includes Cancer in her maternal aunt and other; Hypertension in her father. There is no history of Thyroid disease. No Known Allergies Current Outpatient Prescriptions on File Prior to Visit  Medication Sig Dispense Refill  . aspirin 81 MG tablet Take 160 mg by mouth daily.    Marland Kitchen. levothyroxine (SYNTHROID)  100 MCG tablet Take 1 tablet (100 mcg total) by mouth daily before breakfast. 30 tablet 11  . lisinopril-hydrochlorothiazide (ZESTORETIC) 20-12.5 MG per tablet Take 2 tablets by mouth daily. 180 tablet 3  . triamcinolone cream (KENALOG) 0.1 % Apply topically 2 (two) times daily. 30 g 0   No current facility-administered medications on file prior to visit.    Review of Systems  Constitutional: Negative for unusual diaphoresis or other sweats  HENT: Negative for ringing in ear Eyes: Negative for double vision or worsening visual disturbance.  Respiratory: Negative for choking and stridor.   Gastrointestinal: Negative for vomiting or other signifcant bowel change Genitourinary: Negative for hematuria or decreased urine volume.  Musculoskeletal: Negative for other MSK pain or swelling Skin: Negative for color change and worsening wound.  Neurological: Negative for tremors and numbness other than noted  Psychiatric/Behavioral: Negative for decreased concentration or agitation other than above       Objective:   Physical Exam BP 118/80 mmHg  Pulse 82  Temp(Src) 98.6 F (37 C) (Oral)  Ht 5\' 11"  (1.803 m)  Wt 214 lb 6 oz (97.24 kg)  BMI 29.91 kg/m2  SpO2 98% VS noted, not ill appearing Constitutional: Pt appears well-developed, well-nourished.  HENT: Head: NCAT.  Right Ear: External ear normal.  Left Ear: External ear normal.  Eyes: . Pupils are equal, round, and reactive to light. Conjunctivae and EOM are normal Bilat tm's with mild erythema.  Max sinus area tender.  Pharynx with mild erythema, no exudate  Neck: Normal range of motion. Neck supple.  Cardiovascular: Normal rate and regular rhythm.   Pulmonary/Chest: Effort normal and breath sounds normal.  Abd:  Soft, NT, ND, + BS Neurological: Pt is alert. Not confused , motor grossly intact Skin: Skin is warm. No rash Psychiatric: Pt behavior is normal. No agitation.     Assessment & Plan:

## 2014-07-26 NOTE — Progress Notes (Signed)
Pre visit review using our clinic review tool, if applicable. No additional management support is needed unless otherwise documented below in the visit note. 

## 2014-07-26 NOTE — Patient Instructions (Signed)
Please take all new medication as prescribed - the cough medicine as needed  Please continue all other medications as before, and refills have been done if requested.  Please have the pharmacy call with any other refills you may need.  Please continue your efforts at being more active, low cholesterol diet, and weight control.  Please keep your appointments with your specialists as you may have planned  Please go to the LAB in the Basement (turn left off the elevator) for the tests to be done today  .[email or web address]  Please remember to sign up for MyChart if you have not done so, as this will be important to you in the future with finding out test results, communicating by private email, and scheduling acute appointments online when needed.  Please return in 6 months, or sooner if needed, with Lab testing done 3-5 days before

## 2014-07-27 LAB — LIPID PANEL
Cholesterol: 193 mg/dL (ref 0–200)
HDL: 34.3 mg/dL — ABNORMAL LOW (ref >39.00)
LDL Cholesterol: 144 mg/dL — ABNORMAL HIGH (ref 0–99)
NONHDL: 158.7
Total CHOL/HDL Ratio: 6
Triglycerides: 76 mg/dL (ref 0.0–149.0)
VLDL: 15.2 mg/dL (ref 0.0–40.0)

## 2014-07-29 ENCOUNTER — Encounter: Payer: Self-pay | Admitting: Internal Medicine

## 2014-07-31 NOTE — Assessment & Plan Note (Signed)
C/w viral illness, for cough med prn,  to f/u any worsening symptoms or concerns

## 2014-07-31 NOTE — Assessment & Plan Note (Signed)
stable overall by history and exam, recent data reviewed with pt, and pt to continue medical treatment as before,  to f/u any worsening symptoms or concerns BP Readings from Last 3 Encounters:  07/26/14 118/80  02/03/14 138/98  01/18/14 170/100

## 2014-07-31 NOTE — Assessment & Plan Note (Signed)
stable overall by history and exam, recent data reviewed with pt, and pt to continue medical treatment as before,  to f/u any worsening symptoms or concerns, for f/u lab today, consider statin 

## 2014-07-31 NOTE — Assessment & Plan Note (Signed)
stable overall by history and exam, recent data reviewed with pt, and pt to continue medical treatment as before,  to f/u any worsening symptoms or concerns, for f/u lab today 

## 2014-08-02 ENCOUNTER — Ambulatory Visit: Payer: Federal, State, Local not specified - PPO | Admitting: Endocrinology

## 2014-08-08 ENCOUNTER — Encounter: Payer: Self-pay | Admitting: Endocrinology

## 2014-08-08 ENCOUNTER — Ambulatory Visit (INDEPENDENT_AMBULATORY_CARE_PROVIDER_SITE_OTHER): Payer: Federal, State, Local not specified - PPO | Admitting: Endocrinology

## 2014-08-08 VITALS — BP 136/82 | HR 67 | Temp 98.8°F | Ht 71.0 in | Wt 212.0 lb

## 2014-08-08 DIAGNOSIS — E039 Hypothyroidism, unspecified: Secondary | ICD-10-CM

## 2014-08-08 DIAGNOSIS — E049 Nontoxic goiter, unspecified: Secondary | ICD-10-CM

## 2014-08-08 MED ORDER — LEVOTHYROXINE SODIUM 125 MCG PO TABS: 125.0000 ug | ORAL_TABLET | Freq: Every day | ORAL | Status: DC

## 2014-08-08 NOTE — Patient Instructions (Addendum)
i have sent a prescription to your pharmacy, to increase the thyroid pill.  Let's also recheck the ultrasound. you will receive a phone call, about a day and time for an appointment.   Please repeat the blood test in 1 month.  Please come back for a follow-up appointment in 6 months.

## 2014-08-08 NOTE — Progress Notes (Signed)
   Subjective:    Patient ID: Stacy Newton, female    DOB: 05/14/1966, 48 y.o.   MRN: [id number]  HPI Pt returns for f/u of chronic primary hypothyroidism (dx'ed 1984, but she was not rx'ed medication until 2015--pt says she did not need medication until then). pt states moderate swelling at the right anterior neck, and assoc dry skin.   Past Medical History  Diagnosis Date  . HOT FLASHES 06/25/2010  . HYPERLIPIDEMIA 06/25/2010  . HYPERTENSION 05/14/2010  . THYROMEGALY 05/14/2010  . Abnormal TSH 09/07/2011  . Unspecified hypothyroidism 01/20/2014    Past Surgical History  Procedure Laterality Date  . Abdominal hysterectomy    . Cesarean section    . Oophorectomy      unilateral , left she thinks    History   Social History  . Marital Status: Single    Spouse Name: N/A    Number of Children: 2  . Years of Education: N/A   Occupational History  . [employer]    Social History Main Topics  . Smoking status: Never Smoker   . Smokeless tobacco: Not on file  . Alcohol Use: Yes     Comment: social, rare  . Drug Use: No  . Sexual Activity: Not on file   Other Topics Concern  . Not on file   Social History Narrative    Current Outpatient Prescriptions on File Prior to Visit  Medication Sig Dispense Refill  . aspirin 81 MG tablet Take 160 mg by mouth daily.    . fexofenadine (ALLEGRA) 180 MG tablet Take 1 tablet (180 mg total) by mouth daily. 90 tablet 3  . HYDROcodone-homatropine (HYCODAN) 5-1.5 MG/5ML syrup Take 5 mLs by mouth every 6 (six) hours as needed for cough. 180 mL 0  . lisinopril-hydrochlorothiazide (ZESTORETIC) 20-12.5 MG per tablet Take 2 tablets by mouth daily. 180 tablet 3  . triamcinolone cream (KENALOG) 0.1 % Apply topically 2 (two) times daily. 30 g 0   No current facility-administered medications on file prior to visit.    No Known Allergies  Family History  Problem Relation Age of Onset  . Hypertension Father   . Cancer  Maternal Aunt     breast  . Cancer Other     ovary  . Thyroid disease Neg Hx     BP 136/82 mmHg  Pulse 67  Temp(Src) 98.8 F (37.1 C) (Oral)  Ht 5\' 11"  (1.803 m)  Wt 212 lb (96.163 kg)  BMI 29.58 kg/m2  SpO2 90%    Review of Systems Denies weight change and neck pain.    Objective:   Physical Exam VITAL SIGNS:  See vs page GENERAL: no distress NECK: large right sided thyroid mass.  left lobe is only slightly enlarged   Lab Results  Component Value Date   TSH 12.93* 07/26/2014      Assessment & Plan:  Hypothyroidism: mild exacerbation Right thyroid mass, new  Patient is advised the following: Patient Instructions  i have sent a prescription to your pharmacy, to increase the thyroid pill.  Let's also recheck the ultrasound. you will receive a phone call, about a day and time for an appointment.   Please repeat the blood test in 1 month.  Please come back for a follow-up appointment in 6 months.

## 2014-08-29 ENCOUNTER — Ambulatory Visit
Admission: RE | Admit: 2014-08-29 | Discharge: 2014-08-29 | Disposition: A | Discharge status: Home / Self Care | Payer: Federal, State, Local not specified - PPO | Source: Ambulatory Visit | Attending: Endocrinology | Admitting: Endocrinology

## 2014-08-29 DIAGNOSIS — E049 Nontoxic goiter, unspecified: Secondary | ICD-10-CM

## 2015-01-25 ENCOUNTER — Ambulatory Visit: Payer: Federal, State, Local not specified - PPO | Admitting: Internal Medicine

## 2015-02-20 ENCOUNTER — Ambulatory Visit (INDEPENDENT_AMBULATORY_CARE_PROVIDER_SITE_OTHER): Payer: Federal, State, Local not specified - PPO | Admitting: Endocrinology

## 2015-02-20 ENCOUNTER — Encounter: Payer: Self-pay | Admitting: Endocrinology

## 2015-02-20 ENCOUNTER — Other Ambulatory Visit (HOSPITAL_COMMUNITY)
Admission: RE | Admit: 2015-02-20 | Discharge: 2015-02-20 | Disposition: A | Discharge status: Home / Self Care | Payer: Federal, State, Local not specified - PPO | Source: Ambulatory Visit | Attending: Endocrinology | Admitting: Endocrinology

## 2015-02-20 VITALS — BP 136/86 | HR 62 | Temp 98.2°F | Ht 71.0 in | Wt 213.0 lb

## 2015-02-20 DIAGNOSIS — E079 Disorder of thyroid, unspecified: Secondary | ICD-10-CM

## 2015-02-20 DIAGNOSIS — E039 Hypothyroidism, unspecified: Secondary | ICD-10-CM | POA: Diagnosis not present

## 2015-02-20 DIAGNOSIS — E041 Nontoxic single thyroid nodule: Secondary | ICD-10-CM | POA: Insufficient documentation

## 2015-02-20 LAB — TSH: TSH: 39.29 u[IU]/mL — AB (ref 0.35–4.50)

## 2015-02-20 MED ORDER — LEVOTHYROXINE SODIUM 137 MCG PO TABS: 137.0000 ug | ORAL_TABLET | Freq: Every day | ORAL | Status: DC

## 2015-02-20 NOTE — Patient Instructions (Addendum)
blood tests are requested for you today.  We'll let you know about the results of this and the biopsy.  Please come back for a follow-up appointment in 6 months.

## 2015-02-20 NOTE — Progress Notes (Signed)
Subjective:    Patient ID: Stacy Newton, female    DOB: Jan 16, 1966, 49 y.o.   MRN: [id number]  HPI Pt returns for f/u of chronic primary hypothyroidism (dx'ed 1984, but she was not rx'ed medication until 2015--pt says she did not need medication until then; [place] is early 2016 showed heterogeneous and enlarged gland without focal nodule ).  Since synthroid was increased, pt states she feels no different, and well in general.   Past Medical History  Diagnosis Date  . HOT FLASHES 06/25/2010  . HYPERLIPIDEMIA 06/25/2010  . HYPERTENSION 05/14/2010  . THYROMEGALY 05/14/2010  . Abnormal TSH 09/07/2011  . Unspecified hypothyroidism 01/20/2014    Past Surgical History  Procedure Laterality Date  . Abdominal hysterectomy    . Cesarean section    . Oophorectomy      unilateral , left she thinks    History   Social History  . Marital Status: Single    Spouse Name: N/A  . Number of Children: 2  . 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 Smoking status: Never Smoker   . Smokeless tobacco: Not on file  . Alcohol Use: Yes     Comment: social, rare  . Drug Use: No  . Sexual Activity: Not on file   Other Topics Concern  . Not on file   Social History Narrative    Current Outpatient Prescriptions on File Prior to Visit  Medication Sig Dispense Refill  . aspirin 81 MG tablet Take 160 mg by mouth daily.    . fexofenadine (ALLEGRA) 180 MG tablet Take 1 tablet (180 mg total) by mouth daily. 90 tablet 3  . HYDROcodone-homatropine (HYCODAN) 5-1.5 MG/5ML syrup Take 5 mLs by mouth every 6 (six) hours as needed for cough. 180 mL 0  . triamcinolone cream (KENALOG) 0.1 % Apply topically 2 (two) times daily. 30 g 0  . lisinopril-hydrochlorothiazide (ZESTORETIC) 20-12.5 MG per tablet Take 2 tablets by mouth daily. 180 tablet 3   No current facility-administered medications on file prior to visit.    No Known  Allergies  Family History  Problem Relation Age of Onset  . Hypertension Father   . Cancer Maternal Aunt     breast  . Cancer Other     ovary  . Thyroid disease Neg Hx     BP 136/86 mmHg  Pulse 62  Temp(Src) 98.2 F (36.8 C) (Oral)  Ht  (1.803 m)  Wt 213 lb (96.616 kg)  BMI 29.72 kg/m2  SpO2 99%    Review of Systems Denies weight change    Objective:   Physical Exam VITAL SIGNS:  See vs page GENERAL: no distress eyes: no periorbital swelling, no proptosis NECK: large right sided thyroid mass is again noted.  left lobe is only slightly enlarged.  Skin: not diaphoretic Neuro: no tremor PSYCH: Alert and well-oriented.  Does not appear anxious nor depressed.   Lab Results  Component Value Date   TSH 39.29* 02/20/2015    thyroid needle bx: consent obtained, signed form on chart The area is first sprayed with cooling agent local: xylocaine 2%, with epinephrine prep: alcohol pad 4 bxs are done with 25 and 27g needles no complications    Assessment & Plan:  Thyroid mass, persistent Hypothyroidism, worse   Patient is advised the following: Patient Instructions  blood tests are requested for you today.  We'll let you know about the  results of this and the biopsy.  Please come back for a follow-up appointment in 6 months.     addendum: i have sent a prescription to your pharmacy, to increase synthroid.

## 2015-05-17 ENCOUNTER — Other Ambulatory Visit: Payer: Self-pay | Admitting: Internal Medicine

## 2015-08-31 ENCOUNTER — Ambulatory Visit (INDEPENDENT_AMBULATORY_CARE_PROVIDER_SITE_OTHER): Payer: Federal, State, Local not specified - PPO | Admitting: Endocrinology

## 2015-08-31 ENCOUNTER — Encounter: Payer: Self-pay | Admitting: Endocrinology

## 2015-08-31 VITALS — BP 136/92 | HR 69 | Temp 98.3°F | Ht 71.0 in | Wt 213.0 lb

## 2015-08-31 DIAGNOSIS — E039 Hypothyroidism, unspecified: Secondary | ICD-10-CM

## 2015-08-31 LAB — TSH: TSH: 63.93 u[IU]/mL — AB (ref 0.35–4.50)

## 2015-08-31 MED ORDER — LEVOTHYROXINE SODIUM 150 MCG PO TABS: 150.0000 ug | ORAL_TABLET | Freq: Every day | ORAL | Status: DC

## 2015-08-31 MED ORDER — AZITHROMYCIN 500 MG PO TABS: 500.0000 mg | ORAL_TABLET | Freq: Every day | ORAL | Status: DC

## 2015-08-31 NOTE — Patient Instructions (Addendum)
i have sent a prescription to your pharmacy, for an antibiotic pill. Loratadine-d (non-prescription) will help your congestion. I hope you feel better soon.  If you don't feel better by next week, please call Dr Jonny RuizJohn.  Please call him sooner if you get worse. blood tests are requested for you today.  We'll let you know about the results. If you miss a day of the thyroid pill, you can take 2 the next day.   Please come back for a follow-up appointment in 6 months

## 2015-08-31 NOTE — Progress Notes (Signed)
Subjective:    Patient ID: Stacy Newton, female    DOB: 07-12-1966, 50 y.o.   MRN: [id number]  HPI Pt returns for f/u of chronic primary hypothyroidism (dx'ed 1984, but she was not rx'ed medication until 2015--pt says she did not need medication until then; [place] is early 2016 showed heterogeneous and enlarged gland without focal nodule; however, PE has consistently showed large right thyroid mass; bx of the mass in mid-2016 showed Beth Cat 2).  Pt says she misses the synthroid 3-4 times per month. Pt states 1 week of slight dry-quality cough in the chest, and assoc nasal congestion.  Past Medical History  Diagnosis Date  . HOT FLASHES 06/25/2010  . HYPERLIPIDEMIA 06/25/2010  . HYPERTENSION 05/14/2010  . THYROMEGALY 05/14/2010  . Abnormal TSH 09/07/2011  . Unspecified hypothyroidism 01/20/2014    Past Surgical History  Procedure Laterality Date  . Abdominal hysterectomy    . Cesarean section    . Oophorectomy      unilateral , left she thinks    Social History   Social History  . Marital Status: Single    Spouse Name: N/A  . Number of Children: 2  . 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 Smoking status: Never Smoker   . Smokeless tobacco: Not on file  . Alcohol Use: Yes     Comment: social, rare  . Drug Use: No  . Sexual Activity: Not on file   Other Topics Concern  . Not on file   Social History Narrative    Current Outpatient Prescriptions on File Prior to Visit  Medication Sig Dispense Refill  . aspirin 81 MG tablet Take 160 mg by mouth daily.    . fexofenadine (ALLEGRA) 180 MG tablet Take 1 tablet (180 mg total) by mouth daily. 90 tablet 3  . lisinopril-hydrochlorothiazide (PRINZIDE,ZESTORETIC) 20-12.5 MG per tablet TAKE TWO TABLETS BY MOUTH ONCE DAILY 180 tablet 1  . triamcinolone cream (KENALOG) 0.1 % Apply topically 2 (two) times daily. 30 g 0  . HYDROcodone-homatropine (HYCODAN) 5-1.5  MG/5ML syrup Take 5 mLs by mouth every 6 (six) hours as needed for cough. (Patient not taking: Reported on 08/31/2015) 180 mL 0   No current facility-administered medications on file prior to visit.    No Known Allergies  Family History  Problem Relation Age of Onset  . Hypertension Father   . Cancer Maternal Aunt     breast  . Cancer Other     ovary  . Thyroid disease Neg Hx     BP 136/92 mmHg  Pulse 69  Temp(Src) 98.3 F (36.8 C) (Oral)  Ht 5\' 11"  (1.803 m)  Wt 213 lb (96.616 kg)  BMI 29.72 kg/m2  SpO2 96%  Review of Systems Denies fever and earache.     Objective:   Physical Exam VITAL SIGNS:  See vs page GENERAL: no distress head: no deformity eyes: no periorbital swelling, no proptosis external nose and ears are normal mouth: no lesion seen Both tm's are very red (R>L) NECK: large right thyroid mass is again noted.  No palpable lymphadenopathy at the anterior neck. LUNGS:  Clear to auscultation.    Lab Results  Component Value Date   TSH 63.93* 08/31/2015      Assessment & Plan:  URI: new Thyroid mass: clinically unchanged.  We'll recheck [place] in the future. Hypothyroidism: ? compliance with synthroid   Patient is advised  the following: Patient Instructions  i have sent a prescription to your pharmacy, for an antibiotic pill. Loratadine-d (non-prescription) will help your congestion. I hope you feel better soon.  If you don't feel better by next week, please call Dr Jonny RuizJohn.  Please call him sooner if you get worse. blood tests are requested for you today.  We'll let you know about the results. If you miss a day of the thyroid pill, you can take 2 the next day.   Please come back for a follow-up appointment in 6 months  addendum: i have sent a prescription to your pharmacy, to increase synthroid.  Please take as rx'ed.  recheck TSH in 1 month.

## 2016-03-06 ENCOUNTER — Ambulatory Visit (INDEPENDENT_AMBULATORY_CARE_PROVIDER_SITE_OTHER): Payer: Federal, State, Local not specified - PPO | Admitting: Endocrinology

## 2016-03-06 VITALS — BP 169/102 | HR 76 | Temp 98.2°F | Ht 71.0 in | Wt 199.4 lb

## 2016-03-06 DIAGNOSIS — E039 Hypothyroidism, unspecified: Secondary | ICD-10-CM

## 2016-03-06 DIAGNOSIS — R221 Localized swelling, mass and lump, neck: Secondary | ICD-10-CM | POA: Diagnosis not present

## 2016-03-06 DIAGNOSIS — E079 Disorder of thyroid, unspecified: Secondary | ICD-10-CM | POA: Diagnosis not present

## 2016-03-06 LAB — POCT GLYCOSYLATED HEMOGLOBIN (HGB A1C): Hemoglobin A1C: 5.5

## 2016-03-06 LAB — BASIC METABOLIC PANEL
BUN: 10 mg/dL (ref 6–23)
CO2: 32 meq/L (ref 19–32)
CREATININE: 0.88 mg/dL (ref 0.40–1.20)
Calcium: 9.8 mg/dL (ref 8.4–10.5)
Chloride: 100 mEq/L (ref 96–112)
GFR: 86.98 mL/min (ref >60.00)
GLUCOSE: 82 mg/dL (ref 70–99)
Potassium: 3.8 mEq/L (ref 3.5–5.1)
Sodium: 138 mEq/L (ref 135–145)

## 2016-03-06 LAB — TSH: TSH: 45.59 u[IU]/mL — AB (ref 0.35–4.50)

## 2016-03-06 MED ORDER — LEVOTHYROXINE SODIUM 200 MCG PO TABS: 200.0000 ug | ORAL_TABLET | Freq: Every day | ORAL | Status: DC

## 2016-03-06 NOTE — Patient Instructions (Addendum)
A thyroid blood test is requested for you today.  We'll let you know about the results. let's also check a CT scan.  you will receive a phone call, about a day and time for an appointment It is best to never miss the thyroid pill.  However, if you miss a day of the thyroid pill, you can take 2 the next day.   Please have a blood pressure check soon, at Dr Raphael GibneyJohn's office Please come back for a follow-up appointment in 4 months.

## 2016-03-06 NOTE — Progress Notes (Signed)
Subjective:    Patient ID: Stacy Newton, female    DOB: 02-10-1966, 50 y.o.   MRN: [id number]  HPI  The state of at least three ongoing medical problems is addressed today, with interval history of each noted here: Pt returns for f/u of chronic primary hypothyroidism (dx'ed 1984, but she was not rx'ed medication until 2015--pt says she did not need medication until then; [place] is early 2016 showed heterogeneous and enlarged gland without focal nodule; however, PE has consistently showed large right thyroid mass; bx of the mass in mid-2016 showed Beth Cat 2).   She has moderate swelling of the anterior neck, but no assoc neck pain.  She has fatigue.  She says she never misses the synthroid. Past Medical History  Diagnosis Date  . HOT FLASHES 06/25/2010  . HYPERLIPIDEMIA 06/25/2010  . HYPERTENSION 05/14/2010  . THYROMEGALY 05/14/2010  . Abnormal TSH 09/07/2011  . Unspecified hypothyroidism 01/20/2014    Past Surgical History  Procedure Laterality Date  . Abdominal hysterectomy    . Cesarean section    . Oophorectomy      unilateral , left she thinks    Social History   Social History  . Marital Status: Single    Spouse Name: N/A  . Number of Children: 2  . 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 Smoking status: Never Smoker   . Smokeless tobacco: Not on file  . Alcohol Use: Yes     Comment: social, rare  . Drug Use: No  . Sexual Activity: Not on file   Other Topics Concern  . Not on file   Social History Narrative    Current Outpatient Prescriptions on File Prior to Visit  Medication Sig Dispense Refill  . aspirin 81 MG tablet Take 160 mg by mouth daily.    . fexofenadine (ALLEGRA) 180 MG tablet Take 1 tablet (180 mg total) by mouth daily. 90 tablet 3  . lisinopril-hydrochlorothiazide (PRINZIDE,ZESTORETIC) 20-12.5 MG per tablet TAKE TWO TABLETS BY MOUTH ONCE DAILY 180 tablet 1  . triamcinolone  cream (KENALOG) 0.1 % Apply topically 2 (two) times daily. (Patient not taking: Reported on 03/06/2016) 30 g 0   No current facility-administered medications on file prior to visit.    No Known Allergies  Family History  Problem Relation Age of Onset  . Hypertension Father   . Cancer Maternal Aunt     breast  . Cancer Other     ovary  . Thyroid disease Neg Hx     BP 169/102 mmHg  Pulse 76  Temp(Src) 98.2 F (36.8 C) (Oral)  Ht  (1.803 m)  Wt 199 lb 6.4 oz (90.447 kg)  BMI 27.82 kg/m2  SpO2 99%  Review of Systems Denies sob and dysphagia.     Objective:   Physical Exam VITAL SIGNS:  See vs page GENERAL: no distress NECK: large right anterior neck mass is again noted.  No palpable lymphadenopathy at the anterior neck.  Lab Results  Component Value Date   TSH 45.59* 03/06/2016      Assessment & Plan:  Hypothyroidism: still poorly controlled.  This is usually due to noncompliance.   Neck mass, persistent.  It may be due to anomaly of thyroid cartilage, but not sure.   HTN: not well-controlled.  Patient is advised the following: Patient Instructions  A thyroid blood test is requested for you today.  We'll let  you know about the results. let's also check a CT scan.  you will receive a phone call, about a day and time for an appointment It is best to never miss the thyroid pill.  However, if you miss a day of the thyroid pill, you can take 2 the next day.   Please have a blood pressure check soon, at Dr Raphael GibneyJohn's office Please come back for a follow-up appointment in 4 months.   Romero BellingELLISON, Assia Meanor, MD

## 2016-03-28 ENCOUNTER — Telehealth: Payer: Self-pay | Admitting: Endocrinology

## 2016-04-02 ENCOUNTER — Other Ambulatory Visit: Payer: Federal, State, Local not specified - PPO

## 2016-05-14 DIAGNOSIS — H40033 Anatomical narrow angle, bilateral: Secondary | ICD-10-CM | POA: Diagnosis not present

## 2016-05-14 DIAGNOSIS — H04123 Dry eye syndrome of bilateral lacrimal glands: Secondary | ICD-10-CM | POA: Diagnosis not present

## 2016-07-05 ENCOUNTER — Telehealth: Payer: Self-pay | Admitting: Endocrinology

## 2016-07-05 NOTE — Telephone Encounter (Signed)
Pt called and said that she never got her CT Scan done that was ordered, she wanted to know does she need to keep the appointment for Monday or should she reschedule?

## 2016-07-07 NOTE — Progress Notes (Signed)
   Subjective:    Patient ID: Stacy Newton, female    DOB: 11/04/1965, 50 y.o.   MRN: [id number]  HPI Pt returns for f/u of chronic primary hypothyroidism (dx'ed 1984, but she was not rx'ed medication until 2015--pt says she did not need medication until then; [place] is early 2016 showed heterogeneous and enlarged gland without focal nodule; however, PE has consistently showed large right thyroid mass; bx of the mass in mid-2016 showed Beth Cat 2; she was advised to have neck CT, but she declined, due to cost).  Pt says she never misses the synthroid.  pt states she feels well in general.   Past Medical History:  Diagnosis Date  . Abnormal TSH 09/07/2011  . HOT FLASHES 06/25/2010  . HYPERLIPIDEMIA 06/25/2010  . HYPERTENSION 05/14/2010  . THYROMEGALY 05/14/2010  . Unspecified hypothyroidism 01/20/2014    Past Surgical History:  Procedure Laterality Date  . ABDOMINAL HYSTERECTOMY    . CESAREAN SECTION    . OOPHORECTOMY     unilateral , left she thinks    Social History   Social History  . Marital status: Single    Spouse name: N/A  . Number of children: 2  . Years of education: N/A   Occupational History  . [employer]    Social History Main Topics  . Smoking status: Never Smoker  . Smokeless tobacco: Not on file  . Alcohol use Yes     Comment: social, rare  . Drug use: No  . Sexual activity: Not on file   Other Topics Concern  . Not on file   Social History Narrative  . No narrative on file    Current Outpatient Prescriptions on File Prior to Visit  Medication Sig Dispense Refill  . aspirin 81 MG tablet Take 160 mg by mouth.     . fexofenadine (ALLEGRA) 180 MG tablet Take 1 tablet (180 mg total) by mouth daily. (Patient taking differently: Take 180 mg by mouth daily. Only in the summer time) 90 tablet 3  . lisinopril-hydrochlorothiazide (PRINZIDE,ZESTORETIC) 20-12.5 MG per tablet TAKE TWO TABLETS BY MOUTH ONCE DAILY 180 tablet 1   No current  facility-administered medications on file prior to visit.     No Known Allergies  Family History  Problem Relation Age of Onset  . Hypertension Father   . Cancer Maternal Aunt     breast  . Cancer Other     ovary  . Thyroid disease Neg Hx    BP 132/80   Pulse 70   Ht 5\' 11"  (1.803 m)   Wt 194 lb (88 kg)   SpO2 98%   BMI 27.06 kg/m   Review of Systems She has lost weight, due to her efforts.     Objective:   Physical Exam VITAL SIGNS:  See vs page GENERAL: no distress NECK: large right anterior neck mass is again noted.  No palpable lymphadenopathy at the anterior neck.   Lab Results  Component Value Date   TSH 9.22 (H) 07/08/2016      Assessment & Plan:  Hypothyroidism: she needs increased rx.   I have sent a prescription to your pharmacy.

## 2016-07-08 ENCOUNTER — Encounter: Payer: Self-pay | Admitting: Endocrinology

## 2016-07-08 ENCOUNTER — Ambulatory Visit (INDEPENDENT_AMBULATORY_CARE_PROVIDER_SITE_OTHER): Payer: Federal, State, Local not specified - PPO | Admitting: Endocrinology

## 2016-07-08 VITALS — BP 132/80 | HR 70 | Ht 71.0 in | Wt 194.0 lb

## 2016-07-08 DIAGNOSIS — E038 Other specified hypothyroidism: Secondary | ICD-10-CM

## 2016-07-08 DIAGNOSIS — E063 Autoimmune thyroiditis: Secondary | ICD-10-CM

## 2016-07-08 LAB — TSH: TSH: 9.22 u[IU]/mL — AB (ref 0.35–4.50)

## 2016-07-08 MED ORDER — LEVOTHYROXINE SODIUM 112 MCG PO TABS: 224.0000 ug | ORAL_TABLET | Freq: Every day | ORAL | 3 refills | Status: DC

## 2016-07-08 NOTE — Telephone Encounter (Signed)
Patient kept her appointment and was seen on 07/08/2016 this was discussed with Dr. Everardo AllEllison during her visit.

## 2016-07-08 NOTE — Patient Instructions (Addendum)
A thyroid blood test is requested for you today.  We'll let you know about the results. It is best to never miss the thyroid pill.  However, if you miss a day of the thyroid pill, you can take 2 the next day.  Please come back for a follow-up appointment in 4 months.

## 2016-09-13 ENCOUNTER — Ambulatory Visit (INDEPENDENT_AMBULATORY_CARE_PROVIDER_SITE_OTHER): Payer: Federal, State, Local not specified - PPO | Admitting: Internal Medicine

## 2016-09-13 ENCOUNTER — Ambulatory Visit (INDEPENDENT_AMBULATORY_CARE_PROVIDER_SITE_OTHER)
Admission: RE | Admit: 2016-09-13 | Discharge: 2016-09-13 | Disposition: A | Discharge status: Home / Self Care | Payer: Federal, State, Local not specified - PPO | Source: Ambulatory Visit | Attending: Internal Medicine | Admitting: Internal Medicine

## 2016-09-13 ENCOUNTER — Other Ambulatory Visit (INDEPENDENT_AMBULATORY_CARE_PROVIDER_SITE_OTHER): Payer: Federal, State, Local not specified - PPO

## 2016-09-13 VITALS — BP 130/74 | HR 79 | Temp 98.0°F | Resp 20 | Wt 189.0 lb

## 2016-09-13 DIAGNOSIS — Z0000 Encounter for general adult medical examination without abnormal findings: Secondary | ICD-10-CM

## 2016-09-13 DIAGNOSIS — R7611 Nonspecific reaction to tuberculin skin test without active tuberculosis: Secondary | ICD-10-CM

## 2016-09-13 DIAGNOSIS — E785 Hyperlipidemia, unspecified: Secondary | ICD-10-CM | POA: Diagnosis not present

## 2016-09-13 DIAGNOSIS — R221 Localized swelling, mass and lump, neck: Secondary | ICD-10-CM

## 2016-09-13 DIAGNOSIS — E079 Disorder of thyroid, unspecified: Secondary | ICD-10-CM | POA: Diagnosis not present

## 2016-09-13 DIAGNOSIS — Z021 Encounter for pre-employment examination: Secondary | ICD-10-CM | POA: Diagnosis not present

## 2016-09-13 DIAGNOSIS — Z0001 Encounter for general adult medical examination with abnormal findings: Secondary | ICD-10-CM

## 2016-09-13 DIAGNOSIS — Z23 Encounter for immunization: Secondary | ICD-10-CM | POA: Diagnosis not present

## 2016-09-13 DIAGNOSIS — I10 Essential (primary) hypertension: Secondary | ICD-10-CM | POA: Diagnosis not present

## 2016-09-13 LAB — LIPID PANEL
CHOLESTEROL: 140 mg/dL (ref 0–200)
HDL: 31.5 mg/dL — AB (ref >39.00)
LDL Cholesterol: 90 mg/dL (ref 0–99)
NonHDL: 108.76
TRIGLYCERIDES: 95 mg/dL (ref 0.0–149.0)
Total CHOL/HDL Ratio: 4
VLDL: 19 mg/dL (ref 0.0–40.0)

## 2016-09-13 LAB — URINALYSIS, ROUTINE W REFLEX MICROSCOPIC
Bilirubin Urine: NEGATIVE
Ketones, ur: NEGATIVE
Nitrite: NEGATIVE
PH: 5.5 (ref 5.0–8.0)
Specific Gravity, Urine: 1.015 (ref 1.000–1.030)
Total Protein, Urine: NEGATIVE
URINE GLUCOSE: NEGATIVE
Urobilinogen, UA: 0.2 (ref 0.0–1.0)

## 2016-09-13 LAB — BASIC METABOLIC PANEL
BUN: 20 mg/dL (ref 6–23)
CO2: 30 meq/L (ref 19–32)
CREATININE: 0.84 mg/dL (ref 0.40–1.20)
Calcium: 9.4 mg/dL (ref 8.4–10.5)
Chloride: 104 mEq/L (ref 96–112)
GFR: 91.59 mL/min (ref >60.00)
GLUCOSE: 97 mg/dL (ref 70–99)
Potassium: 3.9 mEq/L (ref 3.5–5.1)
Sodium: 138 mEq/L (ref 135–145)

## 2016-09-13 LAB — HEPATIC FUNCTION PANEL
ALBUMIN: 3.5 g/dL (ref 3.5–5.2)
ALK PHOS: 48 U/L (ref 39–117)
ALT: 6 U/L (ref 0–35)
AST: 12 U/L (ref 0–37)
Bilirubin, Direct: 0.2 mg/dL (ref 0.0–0.3)
Total Bilirubin: 0.3 mg/dL (ref 0.2–1.2)
Total Protein: 8.3 g/dL (ref 6.0–8.3)

## 2016-09-13 LAB — CBC WITH DIFFERENTIAL/PLATELET
Basophils Absolute: 0.1 10*3/uL (ref 0.0–0.1)
Basophils Relative: 0.9 % (ref 0.0–3.0)
Eosinophils Absolute: 0.2 10*3/uL (ref 0.0–0.7)
Eosinophils Relative: 2.3 % (ref 0.0–5.0)
HCT: 29.6 % — ABNORMAL LOW (ref 36.0–46.0)
Hemoglobin: 9.7 g/dL — ABNORMAL LOW (ref 12.0–15.0)
LYMPHS ABS: 3.8 10*3/uL (ref 0.7–4.0)
LYMPHS PCT: 49 % — AB (ref 12.0–46.0)
MCHC: 32.7 g/dL (ref 30.0–36.0)
MCV: 81.4 fl (ref 78.0–100.0)
MONOS PCT: 8.9 % (ref 3.0–12.0)
Monocytes Absolute: 0.7 10*3/uL (ref 0.1–1.0)
Neutro Abs: 3 10*3/uL (ref 1.4–7.7)
Neutrophils Relative %: 38.9 % — ABNORMAL LOW (ref 43.0–77.0)
Platelets: 277 10*3/uL (ref 150.0–400.0)
RBC: 3.64 Mil/uL — ABNORMAL LOW (ref 3.87–5.11)
RDW: 15.3 % (ref 11.5–15.5)
WBC: 7.7 10*3/uL (ref 4.0–10.5)

## 2016-09-13 LAB — TSH: TSH: 0.01 u[IU]/mL — ABNORMAL LOW (ref 0.35–4.50)

## 2016-09-13 MED ORDER — LISINOPRIL-HYDROCHLOROTHIAZIDE 20-12.5 MG PO TABS: 2.0000 | ORAL_TABLET | Freq: Every day | ORAL | 3 refills | Status: DC

## 2016-09-13 NOTE — Assessment & Plan Note (Signed)

## 2016-09-13 NOTE — Assessment & Plan Note (Signed)
Right thyroid located likely nodule, for neck ct with CM

## 2016-09-13 NOTE — Progress Notes (Signed)
Subjective:    Patient ID: Stacy Newton, female    DOB: 05/12/1966, 51 y.o.   MRN: [id number]    HPI  Here for wellness and f/u;  Overall doing ok;  Pt denies Chest pain, worsening SOB, DOE, wheezing, orthopnea, PND, worsening LE edema, palpitations, dizziness or syncope.  Pt denies neurological change such as new headache, facial or extremity weakness.  Pt denies polydipsia, polyuria, or low sugar symptoms. Pt states overall good compliance with treatment and medications, good tolerability, and has been trying to follow appropriate diet.  Pt denies worsening depressive symptoms, suicidal ideation or panic. No fever, night sweats, wt loss, loss of appetite, or other constitutional symptoms.  Pt states good ability with ADL's, has low fall risk, home safety reviewed and adequate, no other significant changes in hearing or vision, and only occasionally active with exercise. Needs CXR for employment reason as has hx of + PPD in 1996.  No other new history Wt Readings from Last 3 Encounters:  09/13/16 189 lb (85.7 kg)  07/08/16 194 lb (88 kg)  03/06/16 199 lb 6.4 oz (90.4 kg)   BP Readings from Last 3 Encounters:  09/13/16 130/74  07/08/16 132/80  03/06/16 (!) 169/102  Has hx of + PPD 1996, cxr required.   Lost job feb 2017, but now with new job, needs form filled out.   Also has mid left neck mass of indeterminate etiology in addition to large right thyroid nodule She is not sure if any change seen. Seen per endo 02/2016 with order but pt deferred to be done at that time, but willing to do so now. Denies hyper or hypo thyroid symptoms such as voice, skin or hair change. Past Medical History:  Diagnosis Date  . Abnormal TSH 09/07/2011  . HOT FLASHES 06/25/2010  . HYPERLIPIDEMIA 06/25/2010  . HYPERTENSION 05/14/2010  . THYROMEGALY 05/14/2010  . Unspecified hypothyroidism 01/20/2014   Past Surgical History:  Procedure Laterality Date  . ABDOMINAL HYSTERECTOMY    . CESAREAN SECTION    .  OOPHORECTOMY     unilateral , left she thinks    reports that she has never smoked. She does not have any smokeless tobacco history on file. She reports that she drinks alcohol. She reports that she does not use drugs. family history includes Cancer in her maternal aunt and other; Hypertension in her father. No Known Allergies Current Outpatient Prescriptions on File Prior to Visit  Medication Sig Dispense Refill  . aspirin 81 MG tablet Take 160 mg by mouth.     . fexofenadine (ALLEGRA) 180 MG tablet Take 1 tablet (180 mg total) by mouth daily. (Patient taking differently: Take 180 mg by mouth daily. Only in the summer time) 90 tablet 3  . levothyroxine (SYNTHROID, LEVOTHROID) 112 MCG tablet Take 2 tablets (224 mcg total) by mouth daily. 180 tablet 3   No current facility-administered medications on file prior to visit.    Review of Systems Constitutional: Negative for increased diaphoresis, or other activity, appetite or siginficant weight change other than noted HENT: Negative for worsening hearing loss, ear pain, facial swelling, mouth sores and neck stiffness.   Eyes: Negative for other worsening pain, redness or visual disturbance.  Respiratory: Negative for choking or stridor Cardiovascular: Negative for other chest pain and palpitations.  Gastrointestinal: Negative for worsening diarrhea, blood in stool, or abdominal distention Genitourinary: Negative for hematuria, flank pain or change in urine volume.  Musculoskeletal: Negative for myalgias or other joint complaints.  Skin:  Negative for other color change and wound or drainage.  Neurological: Negative for syncope and numbness. other than noted Hematological: Negative for adenopathy. or other swelling Psychiatric/Behavioral: Negative for hallucinations, SI, self-injury, decreased concentration or other worsening agitation.  All other system neg per pt    Objective:   Physical Exam BP 130/74   Pulse 79   Temp 98 F (36.7 C)  (Oral)   Resp 20   Wt 189 lb (85.7 kg)   SpO2 98%   BMI 26.36 kg/m  VS noted,  Constitutional: Pt is oriented to person, place, and time. Appears well-developed and well-nourished, in no significant distress Head: Normocephalic and atraumatic  Eyes: Conjunctivae and EOM are normal. Pupils are equal, round, and reactive to light Right Ear: External ear normal.  Left Ear: External ear normal Nose: Nose normal.  Mouth/Throat: Oropharynx is clear and moist  Neck: Normal range of motion. Neck supple. No JVD present. No tracheal deviation present or significant neck LA but has 1.5 cm left midlateral neck with firm fixed, as well as approx 2 cm raised large right thyroid nodule firm as well Cardiovascular: Normal rate, regular rhythm, normal heart sounds and intact distal pulses.   Pulmonary/Chest: Effort normal and breath sounds without rales or wheezing  Abdominal: Soft. Bowel sounds are normal. NT. No HSM  Musculoskeletal: Normal range of motion. Exhibits no edema Lymphadenopathy: Has no cervical adenopathy.  Neurological: Pt is alert and oriented to person, place, and time. Pt has normal reflexes. No cranial nerve deficit. Motor grossly intact Skin: Skin is warm and dry. No rash noted or new ulcers Psychiatric:  Has normal mood and affect. Behavior is normal.  No other new exam findings     Assessment & Plan:

## 2016-09-13 NOTE — Assessment & Plan Note (Signed)
stable overall by history and exam, recent data reviewed with pt, and pt to continue medical treatment as before,  to f/u any worsening symptoms or concerns BP Readings from Last 3 Encounters:  09/13/16 130/74  07/08/16 132/80  03/06/16 (!) 169/102

## 2016-09-13 NOTE — Assessment & Plan Note (Signed)
Noted 1996, for cxr today for preemployment purpose

## 2016-09-13 NOTE — Patient Instructions (Addendum)
Please continue all other medications as before, and refills have been done if requested.  Please have the pharmacy call with any other refills you may need.  Please continue your efforts at being more active, low cholesterol diet, and weight control.  You are otherwise up to date with prevention measures today.  Please keep your appointments with your specialists as you may have planned  Your forms were filled out today  You will be contacted regarding the referral for: colonoscopy, GYN, and the neck ct scan  Please go to the XRAY Department in the Basement (go straight as you get off the elevator) for the x-ray testing  Please go to the LAB in the Basement (turn left off the elevator) for the tests to be done today  You will be contacted by phone if any changes need to be made immediately.  Otherwise, you will receive a letter about your results with an explanation, but please check with MyChart first.  Please remember to sign up for MyChart if you have not done so, as this will be important to you in the future with finding out test results, communicating by private email, and scheduling acute appointments online when needed.  Please return in 1 year for your yearly visit, or sooner if needed, with Lab testing done 3-5 days before

## 2016-09-13 NOTE — Progress Notes (Signed)
Pre visit review using our clinic review tool, if applicable. No additional management support is needed unless otherwise documented below in the visit note. 

## 2016-09-13 NOTE — Assessment & Plan Note (Signed)
stable overall by history and exam, recent data reviewed with pt, and pt to continue medical treatment as before,  to f/u any worsening symptoms or concerns Lab Results  Component Value Date   LDLCALC 90 09/13/2016

## 2016-09-13 NOTE — Assessment & Plan Note (Addendum)
Etiology unclear , cant r/o malignancy or other thyroid nodule, for neck ct with CM  In addition to the time spent performing CPE, I spent an additional 15 minutes face to face,in which greater than 50% of this time was spent in counseling and coordination of care for patient's illness as documented.

## 2016-09-17 ENCOUNTER — Encounter: Payer: Self-pay | Admitting: Internal Medicine

## 2016-09-17 DIAGNOSIS — D649 Anemia, unspecified: Secondary | ICD-10-CM | POA: Insufficient documentation

## 2016-09-18 ENCOUNTER — Encounter: Payer: Self-pay | Admitting: Internal Medicine

## 2016-09-19 ENCOUNTER — Ambulatory Visit (INDEPENDENT_AMBULATORY_CARE_PROVIDER_SITE_OTHER)
Admission: RE | Admit: 2016-09-19 | Discharge: 2016-09-19 | Disposition: A | Discharge status: Home / Self Care | Payer: Federal, State, Local not specified - PPO | Source: Ambulatory Visit | Attending: Internal Medicine | Admitting: Internal Medicine

## 2016-09-19 ENCOUNTER — Telehealth: Payer: Self-pay | Admitting: Internal Medicine

## 2016-09-19 DIAGNOSIS — R221 Localized swelling, mass and lump, neck: Secondary | ICD-10-CM

## 2016-09-19 DIAGNOSIS — E049 Nontoxic goiter, unspecified: Secondary | ICD-10-CM | POA: Diagnosis not present

## 2016-09-19 DIAGNOSIS — E079 Disorder of thyroid, unspecified: Secondary | ICD-10-CM | POA: Diagnosis not present

## 2016-09-19 DIAGNOSIS — R9389 Abnormal findings on diagnostic imaging of other specified body structures: Secondary | ICD-10-CM

## 2016-09-19 MED ORDER — IOPAMIDOL (ISOVUE-300) INJECTION 61%
75.0000 mL | Freq: Once | INTRAVENOUS | Status: AC | PRN
  Administered 2016-09-19: 75 mL via INTRAVENOUS

## 2016-09-19 NOTE — Telephone Encounter (Signed)
error 

## 2016-09-24 DIAGNOSIS — E049 Nontoxic goiter, unspecified: Secondary | ICD-10-CM | POA: Diagnosis not present

## 2016-09-24 DIAGNOSIS — J320 Chronic maxillary sinusitis: Secondary | ICD-10-CM | POA: Diagnosis not present

## 2016-09-24 DIAGNOSIS — J322 Chronic ethmoidal sinusitis: Secondary | ICD-10-CM | POA: Diagnosis not present

## 2016-09-24 DIAGNOSIS — J040 Acute laryngitis: Secondary | ICD-10-CM | POA: Diagnosis not present

## 2016-09-25 ENCOUNTER — Other Ambulatory Visit: Payer: Self-pay | Admitting: Otolaryngology

## 2016-09-25 DIAGNOSIS — E079 Disorder of thyroid, unspecified: Secondary | ICD-10-CM

## 2016-09-30 DIAGNOSIS — J320 Chronic maxillary sinusitis: Secondary | ICD-10-CM | POA: Diagnosis not present

## 2016-09-30 DIAGNOSIS — J322 Chronic ethmoidal sinusitis: Secondary | ICD-10-CM | POA: Diagnosis not present

## 2016-09-30 DIAGNOSIS — J040 Acute laryngitis: Secondary | ICD-10-CM | POA: Diagnosis not present

## 2016-09-30 DIAGNOSIS — R221 Localized swelling, mass and lump, neck: Secondary | ICD-10-CM | POA: Diagnosis not present

## 2016-10-02 ENCOUNTER — Ambulatory Visit (INDEPENDENT_AMBULATORY_CARE_PROVIDER_SITE_OTHER): Payer: Federal, State, Local not specified - PPO | Admitting: Women's Health

## 2016-10-02 ENCOUNTER — Encounter: Payer: Self-pay | Admitting: Women's Health

## 2016-10-02 VITALS — BP 124/70 | Ht 68.0 in | Wt 186.0 lb

## 2016-10-02 DIAGNOSIS — K640 First degree hemorrhoids: Secondary | ICD-10-CM | POA: Diagnosis not present

## 2016-10-02 DIAGNOSIS — A599 Trichomoniasis, unspecified: Secondary | ICD-10-CM | POA: Diagnosis not present

## 2016-10-02 DIAGNOSIS — Z01411 Encounter for gynecological examination (general) (routine) with abnormal findings: Secondary | ICD-10-CM

## 2016-10-02 DIAGNOSIS — Z01419 Encounter for gynecological examination (general) (routine) without abnormal findings: Secondary | ICD-10-CM

## 2016-10-02 DIAGNOSIS — N898 Other specified noninflammatory disorders of vagina: Secondary | ICD-10-CM | POA: Diagnosis not present

## 2016-10-02 LAB — WET PREP FOR TRICH, YEAST, CLUE: YEAST WET PREP: NONE SEEN

## 2016-10-02 MED ORDER — METRONIDAZOLE 500 MG PO TABS: ORAL_TABLET | ORAL | 1 refills | Status: DC

## 2016-10-02 MED ORDER — HYDROCORTISONE ACE-PRAMOXINE 2.5-1 % RE CREA: 1.0000 "application " | TOPICAL_CREAM | Freq: Three times a day (TID) | RECTAL | 3 refills | Status: DC

## 2016-10-02 NOTE — Patient Instructions (Addendum)
[facility] center  [phone] Colonoscopy  Dr Carlean Purl  [phone] or [phone] Vit D3 2000  Daily   Health Maintenance for Postmenopausal Women Introduction Menopause is a normal process in which your reproductive ability comes to an end. This process happens gradually over a span of months to years, usually between the ages of 8 and 9. Menopause is complete when you have missed 12 consecutive menstrual periods. It is important to talk with your health care provider about some of the most common conditions that affect postmenopausal women, such as heart disease, cancer, and bone loss (osteoporosis). Adopting a healthy lifestyle and getting preventive care can help to promote your health and wellness. Those actions can also lower your chances of developing some of these common conditions. What should I know about menopause? During menopause, you may experience a number of symptoms, such as:  Moderate-to-severe hot flashes.  Night sweats.  Decrease in sex drive.  Mood swings.  Headaches.  Tiredness.  Irritability.  Memory problems.  Insomnia. Choosing to treat or not to treat menopausal changes is an individual decision that you make with your health care provider. What should I know about hormone replacement therapy and supplements? Hormone therapy products are effective for treating symptoms that are associated with menopause, such as hot flashes and night sweats. Hormone replacement carries certain risks, especially as you become older. If you are thinking about using estrogen or estrogen with progestin treatments, discuss the benefits and risks with your health care provider. What should I know about heart disease and stroke? Heart disease, heart attack, and stroke become more likely as you age. This may be due, in part, to the hormonal changes that your body experiences during menopause. These can affect how your body processes dietary fats, triglycerides, and cholesterol. Heart attack  and stroke are both medical emergencies. There are many things that you can do to help prevent heart disease and stroke:  Have your blood pressure checked at least every 1-2 years. High blood pressure causes heart disease and increases the risk of stroke.  If you are 26-73 years old, ask your health care provider if you should take aspirin to prevent a heart attack or a stroke.  Do not use any tobacco products, including cigarettes, chewing tobacco, or electronic cigarettes. If you need help quitting, ask your health care provider.  It is important to eat a healthy diet and maintain a healthy weight.  Be sure to include plenty of vegetables, fruits, low-fat dairy products, and lean protein.  Avoid eating foods that are high in solid fats, added sugars, or salt (sodium).  Get regular exercise. This is one of the most important things that you can do for your health.  Try to exercise for at least 150 minutes each week. The type of exercise that you do should increase your heart rate and make you sweat. This is known as moderate-intensity exercise.  Try to do strengthening exercises at least twice each week. Do these in addition to the moderate-intensity exercise.  Know your numbers.Ask your health care provider to check your cholesterol and your blood glucose. Continue to have your blood tested as directed by your health care provider. What should I know about cancer screening? There are several types of cancer. Take the following steps to reduce your risk and to catch any cancer development as early as possible. Breast Cancer  Practice breast self-awareness.  This means understanding how your breasts normally appear and feel.  It also means doing regular breast self-exams.  Let your health care provider know about any changes, no matter how small.  If you are 58 or older, have a clinician do a breast exam (clinical breast exam or CBE) every year. Depending on your age, family history,  and medical history, it may be recommended that you also have a yearly breast X-ray (mammogram).  If you have a family history of breast cancer, talk with your health care provider about genetic screening.  If you are at high risk for breast cancer, talk with your health care provider about having an MRI and a mammogram every year.  Breast cancer (BRCA) gene test is recommended for women who have family members with BRCA-related cancers. Results of the assessment will determine the need for genetic counseling and BRCA1 and for BRCA2 testing. BRCA-related cancers include these types:  Breast. This occurs in males or females.  Ovarian.  Tubal. This may also be called fallopian tube cancer.  Cancer of the abdominal or pelvic lining (peritoneal cancer).  Prostate.  Pancreatic. Cervical, Uterine, and Ovarian Cancer  Your health care provider may recommend that you be screened regularly for cancer of the pelvic organs. These include your ovaries, uterus, and vagina. This screening involves a pelvic exam, which includes checking for microscopic changes to the surface of your cervix (Pap test).  For women ages 21-65, health care providers may recommend a pelvic exam and a Pap test every three years. For women ages 43-65, they may recommend the Pap test and pelvic exam, combined with testing for human papilloma virus (HPV), every five years. Some types of HPV increase your risk of cervical cancer. Testing for HPV may also be done on women of any age who have unclear Pap test results.  Other health care providers may not recommend any screening for nonpregnant women who are considered low risk for pelvic cancer and have no symptoms. Ask your health care provider if a screening pelvic exam is right for you.  If you have had past treatment for cervical cancer or a condition that could lead to cancer, you need Pap tests and screening for cancer for at least 20 years after your treatment. If Pap tests  have been discontinued for you, your risk factors (such as having a new sexual partner) need to be reassessed to determine if you should start having screenings again. Some women have medical problems that increase the chance of getting cervical cancer. In these cases, your health care provider may recommend that you have screening and Pap tests more often.  If you have a family history of uterine cancer or ovarian cancer, talk with your health care provider about genetic screening.  If you have vaginal bleeding after reaching menopause, tell your health care provider.  There are currently no reliable tests available to screen for ovarian cancer. Lung Cancer  Lung cancer screening is recommended for adults 35-24 years old who are at high risk for lung cancer because of a history of smoking. A yearly low-dose CT scan of the lungs is recommended if you:  Currently smoke.  Have a history of at least 30 pack-years of smoking and you currently smoke or have quit within the past 15 years. A pack-year is smoking an average of one pack of cigarettes per day for one year. Yearly screening should:  Continue until it has been 15 years since you quit.  Stop if you develop a health problem that would prevent you from having lung cancer treatment. Colorectal Cancer  This type of cancer  can be detected and can often be prevented.  Routine colorectal cancer screening usually begins at age 49 and continues through age 39.  If you have risk factors for colon cancer, your health care provider may recommend that you be screened at an earlier age.  If you have a family history of colorectal cancer, talk with your health care provider about genetic screening.  Your health care provider may also recommend using home test kits to check for hidden blood in your stool.  A small camera at the end of a tube can be used to examine your colon directly (sigmoidoscopy or colonoscopy). This is done to check for the  earliest forms of colorectal cancer.  Direct examination of the colon should be repeated every 5-10 years until age 52. However, if early forms of precancerous polyps or small growths are found or if you have a family history or genetic risk for colorectal cancer, you may need to be screened more often. Skin Cancer  Check your skin from head to toe regularly.  Monitor any moles. Be sure to tell your health care provider:  About any new moles or changes in moles, especially if there is a change in a mole's shape or color.  If you have a mole that is larger than the size of a pencil eraser.  If any of your family members has a history of skin cancer, especially at a [place] age, talk with your health care provider about genetic screening.  Always use sunscreen. Apply sunscreen liberally and repeatedly throughout the day.  Whenever you are outside, protect yourself by wearing long sleeves, pants, a wide-brimmed hat, and sunglasses. What should I know about osteoporosis? Osteoporosis is a condition in which bone destruction happens more quickly than new bone creation. After menopause, you may be at an increased risk for osteoporosis. To help prevent osteoporosis or the bone fractures that can happen because of osteoporosis, the following is recommended:  If you are 83-5 years old, get at least 1,000 mg of calcium and at least 600 mg of vitamin D per day.  If you are older than age 11 but younger than age 30, get at least 1,200 mg of calcium and at least 600 mg of vitamin D per day.  If you are older than age 47, get at least 1,200 mg of calcium and at least 800 mg of vitamin D per day. Smoking and excessive alcohol intake increase the risk of osteoporosis. Eat foods that are rich in calcium and vitamin D, and do weight-bearing exercises several times each week as directed by your health care provider. What should I know about how menopause affects my mental health? Depression may occur at any  age, but it is more common as you become older. Common symptoms of depression include:  Low or sad mood.  Changes in sleep patterns.  Changes in appetite or eating patterns.  Feeling an overall lack of motivation or enjoyment of activities that you previously enjoyed.  Frequent crying spells. Talk with your health care provider if you think that you are experiencing depression. What should I know about immunizations? It is important that you get and maintain your immunizations. These include:  Tetanus, diphtheria, and pertussis (Tdap) booster vaccine.  Influenza every year before the flu season begins.  Pneumonia vaccine.  Shingles vaccine. Your health care provider may also recommend other immunizations. This information is not intended to replace advice given to you by your health care provider. Make sure you discuss any  questions you have with your health care provider. Document Released: 10/04/2005 Document Revised: 03/01/2016 Document Reviewed: 05/16/2015  2017 Elsevier Trichomoniasis Trichomoniasis is an infection caused by an organism called Trichomonas. The infection can affect both women and men. In women, the outer female genitalia and the vagina are affected. In men, the penis is mainly affected, but the prostate and other reproductive organs can also be involved. Trichomoniasis is a sexually transmitted infection (STI) and is most often passed to another person through sexual contact.  RISK FACTORS  Having unprotected sexual intercourse.  Having sexual intercourse with an infected partner. SIGNS AND SYMPTOMS  Symptoms of trichomoniasis in women include:  Abnormal gray-green frothy vaginal discharge.  Itching and irritation of the vagina.  Itching and irritation of the area outside the vagina. Symptoms of trichomoniasis in men include:   Penile discharge with or without pain.  Pain during urination. This results from inflammation of the urethra. DIAGNOSIS    Trichomoniasis may be found during a Pap test or physical exam. Your health care provider may use one of the following methods to help diagnose this infection:  Testing the pH of the vagina with a test tape.  Using a vaginal swab test that checks for the Trichomonas organism. A test is available that provides results within a few minutes.  Examining a urine sample.  Testing vaginal secretions. Your health care provider may test you for other STIs, including HIV. TREATMENT   You may be given medicine to fight the infection. Women should inform their health care provider if they could be or are pregnant. Some medicines used to treat the infection should not be taken during pregnancy.  Your health care provider may recommend over-the-counter medicines or creams to decrease itching or irritation.  Your sexual partner will need to be treated if infected.  Your health care provider may test you for infection again 3 months after treatment. HOME CARE INSTRUCTIONS   Take medicines only as directed by your health care provider.  Take over-the-counter medicine for itching or irritation as directed by your health care provider.  Do not have sexual intercourse while you have the infection.  Women should not douche or wear tampons while they have the infection.  Discuss your infection with your partner. Your partner may have gotten the infection from you, or you may have gotten it from your partner.  Have your sex partner get examined and treated if necessary.  Practice safe, informed, and protected sex.  See your health care provider for other STI testing. SEEK MEDICAL CARE IF:   You still have symptoms after you finish your medicine.  You develop abdominal pain.  You have pain when you urinate.  You have bleeding after sexual intercourse.  You develop a rash.  Your medicine makes you sick or makes you throw up (vomit). MAKE SURE YOU:  Understand these instructions.  Will  watch your condition.  Will get help right away if you are not doing well or get worse. This information is not intended to replace advice given to you by your health care provider. Make sure you discuss any questions you have with your health care provider. Document Released: 02/05/2001 Document Revised: 09/02/2014 Document Reviewed: 05/24/2013 Elsevier Interactive Patient Education  2017 [employer]  Bacterial Vaginosis Bacterial vaginosis is an infection of the vagina. It happens when too many germs (bacteria) grow in the vagina. This infection puts you at risk for infections from sex (STIs). Treating this infection can lower your risk for  some STIs. You should also treat this if you are pregnant. It can cause your baby to be born early. Follow these instructions at home: Medicines  Take over-the-counter and prescription medicines only as told by your doctor.  Take or use your antibiotic medicine as told by your doctor. Do not stop taking or using it even if you start to feel better. General instructions  If you your sexual partner is a woman, tell her that you have this infection. She needs to get treatment if she has symptoms. If you have a female partner, he does not need to be treated.  During treatment:  Avoid sex.  Do not douche.  Avoid alcohol as told.  Avoid breastfeeding as told.  Drink enough fluid to keep your pee (urine) clear or pale yellow.  Keep your vagina and butt (rectum) clean.  Wash the area with warm water every day.  Wipe from front to back after you use the toilet.  Keep all follow-up visits as told by your doctor. This is important. Preventing this condition  Do not douche.  Use only warm water to wash around your vagina.  Use protection when you have sex. This includes:  Latex condoms.  Dental dams.  Limit how many people you have sex with. It is best to only have sex with the same person (be monogamous).  Get tested for STIs. Have your  partner get tested.  Wear underwear that is cotton or lined with cotton.  Avoid tight pants and pantyhose. This is most important in summer.  Do not use any products that have nicotine or tobacco in them. These include cigarettes and e-cigarettes. If you need help quitting, ask your doctor.  Do not use illegal drugs.  Limit how much alcohol you drink. Contact a doctor if:  Your symptoms do not get better, even after you are treated.  You have more discharge or pain when you pee (urinate).  You have a fever.  You have pain in your belly (abdomen).  You have pain with sex.  Your bleed from your vagina between periods. Summary  This infection happens when too many germs (bacteria) grow in the vagina.  Treating this condition can lower your risk for some infections from sex (STIs).  You should also treat this if you are pregnant. It can cause early (premature) birth.  Do not stop taking or using your antibiotic medicine even if you start to feel better. This information is not intended to replace advice given to you by your health care provider. Make sure you discuss any questions you have with your health care provider. Document Released: 05/21/2008 Document Revised: 04/27/2016 Document Reviewed: 04/27/2016 Elsevier Interactive Patient Education  2017 [place] DASH stands for "Dietary Approaches to Stop Hypertension." The DASH eating plan is a healthy eating plan that has been shown to reduce high blood pressure (hypertension). Additional health benefits may include reducing the risk of type 2 diabetes mellitus, heart disease, and stroke. The DASH eating plan may also help with weight loss. What do I need to know about the DASH eating plan? For the DASH eating plan, you will follow these general guidelines:  Choose foods with less than 150 milligrams of sodium per serving (as listed on the food label).  Use salt-free seasonings or herbs instead of table  salt or sea salt.  Check with your health care provider or pharmacist before using salt substitutes.  Eat lower-sodium products. These are often labeled as "low-sodium" or "  no salt added."  Eat fresh foods. Avoid eating a lot of canned foods.  Eat more vegetables, fruits, and low-fat dairy products.  Choose whole grains. Look for the word "whole" as the first word in the ingredient list.  Choose fish and skinless chicken or [place] more often than red meat. Limit fish, poultry, and meat to 6 oz (170 g) each day.  Limit sweets, desserts, sugars, and sugary drinks.  Choose heart-healthy fats.  Eat more home-cooked food and less restaurant, buffet, and fast food.  Limit fried foods.  Do not fry foods. Cook foods using methods such as baking, boiling, grilling, and broiling instead.  When eating at a restaurant, ask that your food be prepared with less salt, or no salt if possible. What foods can I eat? Seek help from a dietitian for individual calorie needs. Grains  Whole grain or whole wheat bread. Brown rice. Whole grain or whole wheat pasta. Quinoa, bulgur, and whole grain cereals. Low-sodium cereals. Corn or whole wheat flour tortillas. Whole grain cornbread. Whole grain crackers. Low-sodium crackers. Vegetables  Fresh or frozen vegetables (raw, steamed, roasted, or grilled). Low-sodium or reduced-sodium tomato and vegetable juices. Low-sodium or reduced-sodium tomato sauce and paste. Low-sodium or reduced-sodium canned vegetables. Fruits  All fresh, canned (in natural juice), or frozen fruits. Meat and Other Protein Products  Ground beef (85% or leaner), grass-fed beef, or beef trimmed of fat. Skinless chicken or [place]. Ground chicken or [place]. Pork trimmed of fat. All fish and seafood. Eggs. Dried beans, peas, or lentils. Unsalted nuts and seeds. Unsalted canned beans. Dairy  Low-fat dairy products, such as skim or 1% milk, 2% or reduced-fat cheeses, low-fat ricotta or cottage  cheese, or plain low-fat yogurt. Low-sodium or reduced-sodium cheeses. Fats and Oils  Tub margarines without trans fats. Light or reduced-fat mayonnaise and salad dressings (reduced sodium). Avocado. Safflower, olive, or canola oils. Natural peanut or almond butter. Other  Unsalted popcorn and pretzels. The items listed above may not be a complete list of recommended foods or beverages. Contact your dietitian for more options.  What foods are not recommended? Grains  White bread. White pasta. White rice. Refined cornbread. Bagels and croissants. Crackers that contain trans fat. Vegetables  Creamed or fried vegetables. Vegetables in a cheese sauce. Regular canned vegetables. Regular canned tomato sauce and paste. Regular tomato and vegetable juices. Fruits  Canned fruit in light or heavy syrup. Fruit juice. Meat and Other Protein Products  Fatty cuts of meat. Ribs, chicken wings, bacon, sausage, bologna, salami, chitterlings, fatback, hot dogs, bratwurst, and packaged luncheon meats. Salted nuts and seeds. Canned beans with salt. Dairy  Whole or 2% milk, cream, half-and-half, and cream cheese. Whole-fat or sweetened yogurt. Full-fat cheeses or blue cheese. Nondairy creamers and whipped toppings. Processed cheese, cheese spreads, or cheese curds. Condiments  Onion and garlic salt, seasoned salt, table salt, and sea salt. Canned and packaged gravies. Worcestershire sauce. Tartar sauce. Barbecue sauce. Teriyaki sauce. Soy sauce, including reduced sodium. Steak sauce. Fish sauce. Oyster sauce. Cocktail sauce. Horseradish. Ketchup and mustard. Meat flavorings and tenderizers. Bouillon cubes. Hot sauce. Tabasco sauce. Marinades. Taco seasonings. Relishes. Fats and Oils  Butter, stick margarine, lard, shortening, ghee, and bacon fat. Coconut, palm kernel, or palm oils. Regular salad dressings. Other  Pickles and olives. Salted popcorn and pretzels. The items listed above may not be a complete list of  foods and beverages to avoid. Contact your dietitian for more information.  Where can I find more information? National Heart,  Lung, and Blood Institute: travelstabloid.com This information is not intended to replace advice given to you by your health care provider. Make sure you discuss any questions you have with your health care provider. Document Released: 08/01/2011 Document Revised: 01/18/2016 Document Reviewed: 06/16/2013 Elsevier Interactive Patient Education  2017 [employer].

## 2016-10-02 NOTE — Progress Notes (Signed)
Doretha SouVaden Valencia Malecha 10/12/1965 [id number]    History:    Presents for annual exam.  New patient. TAH with one ovary (unsure) removed for fibroids many years ago. Occasional hot flushes. Normal Pap history. Overdue for mammogram. Has not had a screening colonoscopy. Primary care manages hypertension and hypothyroidism. Has a goiter and does have follow-up scheduled. Has lost 60 pounds with diet and exercise  Past medical history, past surgical history, family history and social history were all reviewed and documented in the EPIC chart. Works at a daycare, 2 daughters ages 2123 and 5726.  ROS:  A ROS was performed and pertinent positives and negatives are included.  Exam:  Vitals:   10/02/16 1530  BP: 124/70  Weight: 186 lb (84.4 kg)  Height: 5\' 8"  (1.727 m)   Body mass index is 28.28 kg/m.   General appearance:  Normal Thyroid:  Goiter with palpable nodularity. Respiratory  Auscultation:  Clear without wheezing or rhonchi Cardiovascular  Auscultation:  Regular rate, without rubs, murmurs or gallops  Edema/varicosities:  Not grossly evident Abdominal  Soft,nontender, without masses, guarding or rebound.  Liver/spleen:  No organomegaly noted  Hernia:  None appreciated  Skin  Inspection:  Grossly normal   Breasts: Examined lying and sitting.     Right: Without masses, retractions, discharge or axillary adenopathy.     Left: Without masses, retractions, discharge or axillary adenopathy. Gentitourinary   Inguinal/mons:  Normal without inguinal adenopathy  External genitalia:  Normal  BUS/Urethra/Skene's glands:  Normal  Vagina:  Normal  Cervix:  And uterus absent Adnexa/parametria:     Rt: Without masses or tenderness.   Lt: Without masses or tenderness.  Anus and perineum: Normal  Digital rectal exam: Small hemorrhoids Normal sphincter tone   Assessment/Plan:  51 y.o. MBF G3 P2  for annual exam.    TAH with one ovary were removed for fibroids. 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 Has not been sexually active in years husband has ED. Denies need for STD screen. SBE's, annual screening mammogram reviewed importance of annual screen, breast center information given instructed to schedule. Screening colonoscopy reviewed, [facility] information given. Continue healthy lifestyle of decreasing calories and increasing exercise for continued weight loss, congratulated on weight loss. Analpram HC prescription, proper use given and reviewed will use for occasional hemorrhoidal discomfort.    Harrington ChallengerYOUNG,NANCY J WHNP, 4:05 PM 10/02/2016

## 2016-10-07 ENCOUNTER — Ambulatory Visit
Admission: RE | Admit: 2016-10-07 | Discharge: 2016-10-07 | Disposition: A | Discharge status: Home / Self Care | Payer: Federal, State, Local not specified - PPO | Source: Ambulatory Visit | Attending: Otolaryngology | Admitting: Otolaryngology

## 2016-10-07 ENCOUNTER — Encounter: Payer: Self-pay | Admitting: Internal Medicine

## 2016-10-07 DIAGNOSIS — E079 Disorder of thyroid, unspecified: Secondary | ICD-10-CM

## 2016-10-07 DIAGNOSIS — E049 Nontoxic goiter, unspecified: Secondary | ICD-10-CM | POA: Diagnosis not present

## 2016-10-08 DIAGNOSIS — J301 Allergic rhinitis due to pollen: Secondary | ICD-10-CM | POA: Diagnosis not present

## 2016-10-08 DIAGNOSIS — J320 Chronic maxillary sinusitis: Secondary | ICD-10-CM | POA: Diagnosis not present

## 2016-10-08 DIAGNOSIS — J322 Chronic ethmoidal sinusitis: Secondary | ICD-10-CM | POA: Diagnosis not present

## 2016-10-08 DIAGNOSIS — J040 Acute laryngitis: Secondary | ICD-10-CM | POA: Diagnosis not present

## 2016-11-05 ENCOUNTER — Ambulatory Visit (INDEPENDENT_AMBULATORY_CARE_PROVIDER_SITE_OTHER): Payer: Federal, State, Local not specified - PPO | Admitting: Endocrinology

## 2016-11-05 ENCOUNTER — Encounter: Payer: Self-pay | Admitting: Endocrinology

## 2016-11-05 VITALS — BP 122/74 | HR 73 | Ht 68.0 in | Wt 188.0 lb

## 2016-11-05 DIAGNOSIS — E038 Other specified hypothyroidism: Secondary | ICD-10-CM

## 2016-11-05 DIAGNOSIS — E063 Autoimmune thyroiditis: Secondary | ICD-10-CM

## 2016-11-05 LAB — TSH: TSH: 0.95 u[IU]/mL (ref 0.35–4.50)

## 2016-11-05 NOTE — Progress Notes (Signed)
Subjective:    Patient ID: Stacy Newton, female    DOB: 04-17-1966, 51 y.o.   MRN: [id number]  HPI Pt returns for f/u of chronic primary hypothyroidism (dx'ed 1984, but she was not rx'ed medication until 2015--pt says she did not need medication until then; [place] is early 2016 showed heterogeneous and enlarged gland without focal nodule; however, PE has consistently showed large right thyroid mass; bx of the mass in mid-2016 showed Beth Cat 2; f/u US is 2018 was unchanged).  Pt says she never misses the synthroid.  She takes 2x112 mcg/d.  pt states she feels well in general.  She does not notice the goiter.   Past Medical History:  Diagnosis Date  . Abnormal TSH 09/07/2011  . HOT FLASHES 06/25/2010  . HYPERLIPIDEMIA 06/25/2010  . HYPERTENSION 05/14/2010  . THYROMEGALY 05/14/2010  . Unspecified hypothyroidism 01/20/2014    Past Surgical History:  Procedure Laterality Date  . ABDOMINAL HYSTERECTOMY    . CESAREAN SECTION     x2  . OOPHORECTOMY     unilateral , left she thinks    Social History   Social History  . Marital status: Married    Spouse name: N/A  . Number of children: 2  . Years of education: N/A   Occupational History  . [employer] engineer    Social History Main Topics  . Smoking status: Never Smoker  . Smokeless tobacco: Never Used  . Alcohol use Yes     Comment: social, rare  . Drug use: No  . Sexual activity: Yes    Partners: Male   Other Topics Concern  . Not on file   Social History Narrative  . No narrative on file    Current Outpatient Prescriptions on File Prior to Visit  Medication Sig Dispense Refill  . aspirin 81 MG tablet Take 160 mg by mouth.     . fexofenadine (ALLEGRA) 180 MG tablet Take 1 tablet (180 mg total) by mouth daily. (Patient taking differently: Take 180 mg by mouth daily. Only in the summer time) 90 tablet 3  . hydrocortisone-pramoxine (ANALPRAM HC) 2.5-1 % rectal cream Place 1 application rectally 3 (three) times  daily. 30 g 3  . levothyroxine (SYNTHROID, LEVOTHROID) 112 MCG tablet Take 2 tablets (224 mcg total) by mouth daily. 180 tablet 3  . lisinopril-hydrochlorothiazide (PRINZIDE,ZESTORETIC) 20-12.5 MG tablet Take 2 tablets by mouth daily. 180 tablet 3  . metroNIDAZOLE (FLAGYL) 500 MG tablet Take all 4 tablets together 4 tablet 1   No current facility-administered medications on file prior to visit.     No Known Allergies  Family History  Problem Relation Age of Onset  . Cancer Other     ovary  . Hypertension Father   . Cancer Maternal Aunt     breast  . Thyroid disease Neg Hx     BP 122/74   Pulse 73   Ht 5\' 8"  (1.727 m)   Wt 188 lb (85.3 kg)   SpO2 98%   BMI 28.59 kg/m   Review of Systems Denies neck pain.      Objective:   Physical Exam VITAL SIGNS:  See vs page GENERAL: no distress NECK: large prominence of the right ant neck is again noted.  No palpable lymphadenopathy at the anterior neck.   Lab Results  Component Value Date   TSH 0.01 (L) 09/13/2016      Assessment & Plan:  Large pseudomass of the right thyroid, clinically unchanged.  Hypothyroidism, therapy  limited by intermittent med compliance. Patient is advised the following: Patient Instructions  A thyroid blood test is requested for you today.  We'll let you know about the results. Please come back for a follow-up appointment in 4 months.

## 2016-11-05 NOTE — Patient Instructions (Addendum)
A thyroid blood test is requested for you today.  We'll let you know about the results. Please come back for a follow-up appointment in 4 months.    

## 2016-11-14 ENCOUNTER — Encounter: Payer: Self-pay | Admitting: Internal Medicine

## 2017-01-08 ENCOUNTER — Encounter: Payer: Self-pay | Admitting: Gynecology

## 2017-02-17 ENCOUNTER — Telehealth: Payer: Self-pay | Admitting: Endocrinology

## 2017-02-17 ENCOUNTER — Ambulatory Visit: Payer: Federal, State, Local not specified - PPO | Admitting: Endocrinology

## 2017-02-17 DIAGNOSIS — Z0289 Encounter for other administrative examinations: Secondary | ICD-10-CM

## 2017-02-17 MED ORDER — LEVOTHYROXINE SODIUM 112 MCG PO TABS: 224.0000 ug | ORAL_TABLET | Freq: Every day | ORAL | 3 refills | Status: DC

## 2017-02-17 NOTE — Telephone Encounter (Signed)
**  Remind patient they can make refill requests via MyChart**  Medication refill request (Name & Dosage):  levothyroxine (SYNTHROID, LEVOTHROID) 112 MCG tablet  Preferred pharmacy (Name & Address):  [facility] [address] - [place] ([address]), [place] - [address] [phone] (Phone) [phone] (Fax)    Other comments (if applicable):   Notify patient if this can be filled or an appointment is needed first.

## 2017-02-17 NOTE — Telephone Encounter (Signed)
..  lb

## 2017-02-17 NOTE — Telephone Encounter (Signed)
Refill submitted and patient notified via voicemail to call and schedule a follow up for July if she has not already.

## 2017-03-14 NOTE — Telephone Encounter (Signed)
error 

## 2017-08-18 ENCOUNTER — Encounter: Payer: Self-pay | Admitting: Internal Medicine

## 2017-08-18 ENCOUNTER — Ambulatory Visit: Payer: Federal, State, Local not specified - PPO | Admitting: Internal Medicine

## 2017-08-18 DIAGNOSIS — J0110 Acute frontal sinusitis, unspecified: Secondary | ICD-10-CM

## 2017-08-18 MED ORDER — AMOXICILLIN-POT CLAVULANATE 875-125 MG PO TABS: 1.0000 | ORAL_TABLET | Freq: Two times a day (BID) | ORAL | 0 refills | Status: DC

## 2017-08-18 NOTE — Patient Instructions (Signed)
We have sent in the antibiotic called augmentin. Take 1 pill twice a day for 1 week.   We would like you to start taking zyrtec (cetirizine) 1 pill daily for the next 2 weeks.

## 2017-08-18 NOTE — Progress Notes (Signed)
   Subjective:    Patient ID: Stacy Newton, female    DOB: 05/16/1966, 51 y.o.   MRN: [id number]  HPI The patient is a 51 YO female coming in for sinus problems for about 3 weeks or so. She has been getting some better and worse. Some fevers and chills over that time. She denies much cough and no SOB. She is some tired more than usual. Taking cold medication and honey. She is not taking flonase or zyrtec or allergra or claritin. She is not a smoker. Some problems with swollen sinuses and breathing at night time.   Review of Systems  Constitutional: Positive for activity change, appetite change and chills. Negative for fatigue, fever and unexpected weight change.  HENT: Positive for congestion, ear pain, postnasal drip, rhinorrhea, sinus pressure and sore throat. Negative for ear discharge, sinus pain, sneezing, tinnitus, trouble swallowing and voice change.   Eyes: Negative.   Respiratory: Negative for cough, chest tightness, shortness of breath and wheezing.   Cardiovascular: Negative.   Gastrointestinal: Negative.   Neurological: Negative.       Objective:   Physical Exam  Constitutional: She is oriented to person, place, and time. She appears well-developed and well-nourished.  HENT:  Head: Normocephalic and atraumatic.  Oropharynx with redness and clear drainage, nose with swollen turbinates, TMs normal bilaterally  Eyes: EOM are normal.  Neck: Normal range of motion. No thyromegaly present.  Cardiovascular: Normal rate and regular rhythm.  Pulmonary/Chest: Effort normal and breath sounds normal. No respiratory distress. She has no wheezes. She has no rales.  Abdominal: Soft.  Lymphadenopathy:    She has cervical adenopathy.  Neurological: She is alert and oriented to person, place, and time.  Skin: Skin is warm and dry.   Vitals:   08/18/17 0831  BP: 140/90  Pulse: (!) 58  Temp: 97.6 F (36.4 C)  TempSrc: Oral  SpO2: 100%  Weight: 224 lb (101.6 kg)  Height: 5\' 8"   (1.727 m)      Assessment & Plan:

## 2017-08-18 NOTE — Assessment & Plan Note (Signed)
Rx for augmentin today. Encouraged to take zyrtec for the next 2 weeks. Talked to her about early initiation of zyrtec with sinus symptoms and not otc cold medications.

## 2017-10-06 ENCOUNTER — Encounter: Payer: Federal, State, Local not specified - PPO | Admitting: Women's Health

## 2018-03-01 ENCOUNTER — Other Ambulatory Visit: Payer: Self-pay | Admitting: Endocrinology

## 2018-03-01 ENCOUNTER — Other Ambulatory Visit: Payer: Self-pay | Admitting: Internal Medicine

## 2018-03-02 ENCOUNTER — Ambulatory Visit: Payer: Federal, State, Local not specified - PPO | Admitting: Internal Medicine

## 2018-03-02 ENCOUNTER — Encounter: Payer: Self-pay | Admitting: Internal Medicine

## 2018-03-02 ENCOUNTER — Other Ambulatory Visit (INDEPENDENT_AMBULATORY_CARE_PROVIDER_SITE_OTHER): Payer: Federal, State, Local not specified - PPO

## 2018-03-02 VITALS — BP 168/102 | HR 85 | Temp 98.3°F | Ht 68.0 in | Wt 234.0 lb

## 2018-03-02 DIAGNOSIS — J309 Allergic rhinitis, unspecified: Secondary | ICD-10-CM | POA: Insufficient documentation

## 2018-03-02 DIAGNOSIS — D649 Anemia, unspecified: Secondary | ICD-10-CM

## 2018-03-02 DIAGNOSIS — J45909 Unspecified asthma, uncomplicated: Secondary | ICD-10-CM | POA: Insufficient documentation

## 2018-03-02 DIAGNOSIS — J4520 Mild intermittent asthma, uncomplicated: Secondary | ICD-10-CM | POA: Diagnosis not present

## 2018-03-02 DIAGNOSIS — Z0001 Encounter for general adult medical examination with abnormal findings: Secondary | ICD-10-CM | POA: Diagnosis not present

## 2018-03-02 DIAGNOSIS — R7611 Nonspecific reaction to tuberculin skin test without active tuberculosis: Secondary | ICD-10-CM

## 2018-03-02 DIAGNOSIS — E538 Deficiency of other specified B group vitamins: Secondary | ICD-10-CM | POA: Diagnosis not present

## 2018-03-02 DIAGNOSIS — I10 Essential (primary) hypertension: Secondary | ICD-10-CM | POA: Diagnosis not present

## 2018-03-02 DIAGNOSIS — R739 Hyperglycemia, unspecified: Secondary | ICD-10-CM

## 2018-03-02 DIAGNOSIS — H6123 Impacted cerumen, bilateral: Secondary | ICD-10-CM | POA: Diagnosis not present

## 2018-03-02 DIAGNOSIS — M25471 Effusion, right ankle: Secondary | ICD-10-CM | POA: Insufficient documentation

## 2018-03-02 DIAGNOSIS — H9193 Unspecified hearing loss, bilateral: Secondary | ICD-10-CM | POA: Insufficient documentation

## 2018-03-02 LAB — LIPID PANEL
CHOLESTEROL: 244 mg/dL — AB (ref 0–200)
HDL: 51.3 mg/dL (ref >39.00)
LDL Cholesterol: 166 mg/dL — ABNORMAL HIGH (ref 0–99)
NONHDL: 193.16
TRIGLYCERIDES: 137 mg/dL (ref 0.0–149.0)
Total CHOL/HDL Ratio: 5
VLDL: 27.4 mg/dL (ref 0.0–40.0)

## 2018-03-02 LAB — IBC PANEL
Iron: 42 ug/dL (ref 42–145)
SATURATION RATIOS: 9.3 % — AB (ref 20.0–50.0)
TRANSFERRIN: 323 mg/dL (ref 212.0–360.0)

## 2018-03-02 LAB — BASIC METABOLIC PANEL
BUN: 13 mg/dL (ref 6–23)
CHLORIDE: 101 meq/L (ref 96–112)
CO2: 33 mEq/L — ABNORMAL HIGH (ref 19–32)
CREATININE: 1.23 mg/dL — AB (ref 0.40–1.20)
Calcium: 9.3 mg/dL (ref 8.4–10.5)
GFR: 58.65 mL/min — ABNORMAL LOW (ref >60.00)
Glucose, Bld: 132 mg/dL — ABNORMAL HIGH (ref 70–99)
Potassium: 3.6 mEq/L (ref 3.5–5.1)
Sodium: 140 mEq/L (ref 135–145)

## 2018-03-02 LAB — CBC WITH DIFFERENTIAL/PLATELET
BASOS ABS: 0 10*3/uL (ref 0.0–0.1)
Basophils Relative: 0.5 % (ref 0.0–3.0)
EOS ABS: 0.1 10*3/uL (ref 0.0–0.7)
Eosinophils Relative: 1.4 % (ref 0.0–5.0)
HCT: 32.6 % — ABNORMAL LOW (ref 36.0–46.0)
HEMOGLOBIN: 10.7 g/dL — AB (ref 12.0–15.0)
Lymphocytes Relative: 43.1 % (ref 12.0–46.0)
Lymphs Abs: 3 10*3/uL (ref 0.7–4.0)
MCHC: 32.8 g/dL (ref 30.0–36.0)
MCV: 84.3 fl (ref 78.0–100.0)
Monocytes Absolute: 0.4 10*3/uL (ref 0.1–1.0)
Monocytes Relative: 6.1 % (ref 3.0–12.0)
Neutro Abs: 3.4 10*3/uL (ref 1.4–7.7)
Neutrophils Relative %: 48.9 % (ref 43.0–77.0)
PLATELETS: 256 10*3/uL (ref 150.0–400.0)
RBC: 3.87 Mil/uL (ref 3.87–5.11)
RDW: 18.2 % — ABNORMAL HIGH (ref 11.5–15.5)
WBC: 7 10*3/uL (ref 4.0–10.5)

## 2018-03-02 LAB — HEPATIC FUNCTION PANEL
ALK PHOS: 48 U/L (ref 39–117)
ALT: 13 U/L (ref 0–35)
AST: 26 U/L (ref 0–37)
Albumin: 4.1 g/dL (ref 3.5–5.2)
BILIRUBIN DIRECT: 0 mg/dL (ref 0.0–0.3)
TOTAL PROTEIN: 8.6 g/dL — AB (ref 6.0–8.3)
Total Bilirubin: 0.3 mg/dL (ref 0.2–1.2)

## 2018-03-02 LAB — URINALYSIS, ROUTINE W REFLEX MICROSCOPIC
Bilirubin Urine: NEGATIVE
Ketones, ur: NEGATIVE
Leukocytes, UA: NEGATIVE
Nitrite: NEGATIVE
SPECIFIC GRAVITY, URINE: 1.02 (ref 1.000–1.030)
TOTAL PROTEIN, URINE-UPE24: 30 — AB
URINE GLUCOSE: NEGATIVE
Urobilinogen, UA: 0.2 (ref 0.0–1.0)
WBC, UA: NONE SEEN (ref 0–?)
pH: 7.5 (ref 5.0–8.0)

## 2018-03-02 LAB — HEMOGLOBIN A1C: Hgb A1c MFr Bld: 6.1 % (ref 4.6–6.5)

## 2018-03-02 LAB — VITAMIN B12: Vitamin B-12: 825 pg/mL (ref 211–911)

## 2018-03-02 LAB — TSH: TSH: 131.78 u[IU]/mL — AB (ref 0.35–4.50)

## 2018-03-02 MED ORDER — ALBUTEROL SULFATE HFA 108 (90 BASE) MCG/ACT IN AERS: 2.0000 | INHALATION_SPRAY | Freq: Four times a day (QID) | RESPIRATORY_TRACT | 5 refills | Status: DC | PRN

## 2018-03-02 MED ORDER — TRIAMCINOLONE ACETONIDE 55 MCG/ACT NA AERO: 2.0000 | INHALATION_SPRAY | Freq: Every day | NASAL | 12 refills | Status: DC

## 2018-03-02 MED ORDER — LISINOPRIL-HYDROCHLOROTHIAZIDE 20-12.5 MG PO TABS: 2.0000 | ORAL_TABLET | Freq: Every day | ORAL | 3 refills | Status: DC

## 2018-03-02 MED ORDER — CETIRIZINE HCL 10 MG PO TABS: 10.0000 mg | ORAL_TABLET | Freq: Every day | ORAL | 11 refills | Status: DC

## 2018-03-02 MED ORDER — METHYLPREDNISOLONE ACETATE 80 MG/ML IJ SUSP
80.0000 mg | Freq: Once | INTRAMUSCULAR | Status: AC
  Administered 2018-03-02: 80 mg via INTRAMUSCULAR

## 2018-03-02 NOTE — Assessment & Plan Note (Signed)
Very mild, for depomedrol IM 80, albuterol HFA prn,  to f/u any worsening symptoms or concerns

## 2018-03-02 NOTE — Assessment & Plan Note (Signed)
Mild, improved with wax impaction irrigation

## 2018-03-02 NOTE — Patient Instructions (Addendum)
You had the steroid shot today for asthma and allergies  Please take all new medication as prescribed - the inhaler, as well as zyrtec and nasacort as directed  Your ears were irrigated of wax today  Please continue all other medications as before, and refills have been done if requested.  Please have the pharmacy call with any other refills you may need.  Please continue your efforts at being more active, low cholesterol diet, and weight control.  You are otherwise up to date with prevention measures today.  Please keep your appointments with your specialists as you may have planned  Please go to the LAB in the Basement (turn left off the elevator) for the tests to be done today  You will be contacted by phone if any changes need to be made immediately.  Otherwise, you will receive a letter about your results with an explanation, but please check with MyChart first.  Please remember to sign up for MyChart if you have not done so, as this will be important to you in the future with finding out test results, communicating by private email, and scheduling acute appointments online when needed.  Please return in 1 year for your yearly visit, or sooner if needed, with Lab testing done 3-5 days before

## 2018-03-02 NOTE — Assessment & Plan Note (Addendum)
Cant r/o iron def - for f/u labs today  Note:  Total time for pt hx, exam, review of record with pt in the room, determination of diagnoses and plan for further eval and tx is > 40 min, with over 50% spent in coordination and counseling of patient including the differential dx, tx, further evaluation and other management of allergic rhinitis, anemia, asthma, HTN, hearing loss, pos PPD, right ankle effusion

## 2018-03-02 NOTE — Assessment & Plan Note (Signed)
Uncontrolled, for restart meds, urged compliance and f/u BP at home and next visit

## 2018-03-02 NOTE — Progress Notes (Signed)
Subjective:    Patient ID: Stacy Newton, female    DOB: 04/14/1966, 52 y.o.   MRN: [id number]  HPI  Here for wellness and f/u;  Overall doing ok;  Pt denies Chest pain, orthopnea, PND, worsening LE edema, palpitations, dizziness or syncope.  Pt denies neurological change such as new headache, facial or extremity weakness.  Pt denies polydipsia, polyuria, or low sugar symptoms. Pt states overall good compliance with treatment and medications, good tolerability, and has been trying to follow appropriate diet.  Pt denies worsening depressive symptoms, suicidal ideation or panic. No fever, night sweats, wt loss, loss of appetite, or other constitutional symptoms.  Pt states good ability with ADL's, has low fall risk, home safety reviewed and adequate, no other significant changes in hearing or vision, and only occasionally active with exercise.  Needs TB blood test for work purpose.  Does have several wks ongoing nasal allergy symptoms with clearish congestion, itch and sneezing, without fever, pain, ST, cough, swelling but also has had some intermittent mild wheezing for 2 months.  Also having intermitent right ankle discomfort and swelling, mild at this time.  Also with bilat hearing loss ? Wax again, asks for irrigation  No ear pain, HA, or d/c  Has been out of BP meds for some time Past Medical History:  Diagnosis Date  . Abnormal TSH 09/07/2011  . HOT FLASHES 06/25/2010  . HYPERLIPIDEMIA 06/25/2010  . HYPERTENSION 05/14/2010  . THYROMEGALY 05/14/2010  . Unspecified hypothyroidism 01/20/2014   Past Surgical History:  Procedure Laterality Date  . ABDOMINAL HYSTERECTOMY    . CESAREAN SECTION     x2  . OOPHORECTOMY     unilateral , left she thinks    reports that she has never smoked. She has never used smokeless tobacco. She reports that she drinks alcohol. She reports that she does not use drugs. family history includes Cancer in her maternal aunt and other; Hypertension in her  father. No Known Allergies Current Outpatient Medications on File Prior to Visit  Medication Sig Dispense Refill  . aspirin 81 MG tablet Take 160 mg by mouth.     . hydrocortisone-pramoxine (ANALPRAM HC) 2.5-1 % rectal cream Place 1 application rectally 3 (three) times daily. 30 g 3  . levothyroxine (SYNTHROID, LEVOTHROID) 112 MCG tablet TAKE 2 TABLETS BY MOUTH ONCE DAILY 180 tablet 3   No current facility-administered medications on file prior to visit.    Review of Systems Constitutional: Negative for other unusual diaphoresis, sweats, appetite or weight changes HENT: Negative for other worsening hearing loss, ear pain, facial swelling, mouth sores or neck stiffness.   Eyes: Negative for other worsening pain, redness or other visual disturbance.  Respiratory: Negative for other stridor or swelling Cardiovascular: Negative for other palpitations or other chest pain  Gastrointestinal: Negative for worsening diarrhea or loose stools, blood in stool, distention or other pain Genitourinary: Negative for hematuria, flank pain or other change in urine volume.  Musculoskeletal: Negative for myalgias or other joint swelling.  Skin: Negative for other color change, or other wound or worsening drainage.  Neurological: Negative for other syncope or numbness. Hematological: Negative for other adenopathy or swelling Psychiatric/Behavioral: Negative for hallucinations, other worsening agitation, SI, self-injury, or new decreased concentration All other system neg per pt    Objective:   Physical Exam BP (!) 168/102   Pulse 85   Temp 98.3 F (36.8 C) (Oral)   Ht 5\' 8"  (1.727 m)   Wt 234 lb (106.1 kg)  SpO2 98%   BMI 35.58 kg/m  VS noted, not ill appearing Constitutional: Pt is oriented to person, place, and time. Appears well-developed and well-nourished, in no significant distress and comfortable Head: Normocephalic and atraumatic  Eyes: Conjunctivae and EOM are normal. Pupils are equal,  round, and reactive to light Right Ear: External ear normal without discharge Left Ear: External ear normal without discharge bilat wax impactions resolved with irrigation and hearing improved Bilat tm's with mild erythema.  Max sinus areas non tender.  Pharynx with mild erythema, no exudate Nose: Nose without discharge or deformity Mouth/Throat: Oropharynx is without other ulcerations and moist  Neck: Normal range of motion. Neck supple. No JVD present. No tracheal deviation present or significant neck LA or mass Cardiovascular: Normal rate, regular rhythm, normal heart sounds and intact distal pulses.   Pulmonary/Chest: WOB normal and breath sounds decreased without rales amd few trace wheezing  Abdominal: Soft. Bowel sounds are normal. NT. No HSM  Musculoskeletal: Normal range of motion. Exhibits no edema ecept for trace right ankle effusion Lymphadenopathy: Has no other cervical adenopathy.  Neurological: Pt is alert and oriented to person, place, and time. Pt has normal reflexes. No cranial nerve deficit. Motor grossly intact, Gait intact Skin: Skin is warm and dry. No rash noted or new ulcerations Psychiatric:  Has normal mood and affect. Behavior is normal without agitation No other exam findings  Lab Results  Component Value Date   WBC 7.7 09/13/2016   HGB 9.7 (L) 09/13/2016   HCT 29.6 (L) 09/13/2016   PLT 277.0 09/13/2016   GLUCOSE 97 09/13/2016   CHOL 140 09/13/2016   TRIG 95.0 09/13/2016   HDL 31.50 (L) 09/13/2016   LDLDIRECT 157.3 09/06/2011   LDLCALC 90 09/13/2016   ALT 6 09/13/2016   AST 12 09/13/2016   NA 138 09/13/2016   K 3.9 09/13/2016   CL 104 09/13/2016   CREATININE 0.84 09/13/2016   BUN 20 09/13/2016   CO2 30 09/13/2016   TSH 0.95 11/05/2016   HGBA1C 5.5 03/06/2016       Assessment & Plan:

## 2018-03-02 NOTE — Assessment & Plan Note (Signed)

## 2018-03-02 NOTE — Assessment & Plan Note (Signed)
Ok for zyrtec and nasacort asd,  to f/u any worsening symptoms or concerns 

## 2018-03-02 NOTE — Assessment & Plan Note (Signed)
Ok for TB GOLD tesitng for employment purpose as they will not accept CXR per pt

## 2018-03-02 NOTE — Assessment & Plan Note (Signed)
Very mild, ok to hold on specific tx or films today

## 2018-10-24 DIAGNOSIS — H40033 Anatomical narrow angle, bilateral: Secondary | ICD-10-CM | POA: Diagnosis not present

## 2018-10-24 DIAGNOSIS — H04123 Dry eye syndrome of bilateral lacrimal glands: Secondary | ICD-10-CM | POA: Diagnosis not present

## 2019-03-04 ENCOUNTER — Encounter: Payer: Federal, State, Local not specified - PPO | Admitting: Internal Medicine

## 2019-05-05 ENCOUNTER — Other Ambulatory Visit: Payer: Self-pay

## 2019-05-05 ENCOUNTER — Ambulatory Visit (INDEPENDENT_AMBULATORY_CARE_PROVIDER_SITE_OTHER): Payer: Federal, State, Local not specified - PPO | Admitting: Internal Medicine

## 2019-05-05 ENCOUNTER — Encounter: Payer: Self-pay | Admitting: Internal Medicine

## 2019-05-05 ENCOUNTER — Other Ambulatory Visit (INDEPENDENT_AMBULATORY_CARE_PROVIDER_SITE_OTHER): Payer: Federal, State, Local not specified - PPO

## 2019-05-05 VITALS — BP 146/96 | HR 68 | Temp 98.6°F | Ht 68.0 in | Wt 236.0 lb

## 2019-05-05 DIAGNOSIS — Z0001 Encounter for general adult medical examination with abnormal findings: Secondary | ICD-10-CM

## 2019-05-05 DIAGNOSIS — E038 Other specified hypothyroidism: Secondary | ICD-10-CM | POA: Diagnosis not present

## 2019-05-05 DIAGNOSIS — Z114 Encounter for screening for human immunodeficiency virus [HIV]: Secondary | ICD-10-CM

## 2019-05-05 DIAGNOSIS — E063 Autoimmune thyroiditis: Secondary | ICD-10-CM

## 2019-05-05 DIAGNOSIS — E559 Vitamin D deficiency, unspecified: Secondary | ICD-10-CM

## 2019-05-05 DIAGNOSIS — E611 Iron deficiency: Secondary | ICD-10-CM | POA: Diagnosis not present

## 2019-05-05 DIAGNOSIS — E538 Deficiency of other specified B group vitamins: Secondary | ICD-10-CM

## 2019-05-05 DIAGNOSIS — N289 Disorder of kidney and ureter, unspecified: Secondary | ICD-10-CM | POA: Insufficient documentation

## 2019-05-05 DIAGNOSIS — R739 Hyperglycemia, unspecified: Secondary | ICD-10-CM | POA: Diagnosis not present

## 2019-05-05 DIAGNOSIS — L84 Corns and callosities: Secondary | ICD-10-CM

## 2019-05-05 DIAGNOSIS — Z0000 Encounter for general adult medical examination without abnormal findings: Secondary | ICD-10-CM

## 2019-05-05 DIAGNOSIS — Z23 Encounter for immunization: Secondary | ICD-10-CM | POA: Diagnosis not present

## 2019-05-05 DIAGNOSIS — E785 Hyperlipidemia, unspecified: Secondary | ICD-10-CM

## 2019-05-05 LAB — CBC WITH DIFFERENTIAL/PLATELET
Basophils Absolute: 0.1 10*3/uL (ref 0.0–0.1)
Basophils Relative: 1.2 % (ref 0.0–3.0)
Eosinophils Absolute: 0.1 10*3/uL (ref 0.0–0.7)
Eosinophils Relative: 1.8 % (ref 0.0–5.0)
HCT: 35.6 % — ABNORMAL LOW (ref 36.0–46.0)
Hemoglobin: 11.6 g/dL — ABNORMAL LOW (ref 12.0–15.0)
Lymphocytes Relative: 45.5 % (ref 12.0–46.0)
Lymphs Abs: 3 10*3/uL (ref 0.7–4.0)
MCHC: 32.5 g/dL (ref 30.0–36.0)
MCV: 85.9 fl (ref 78.0–100.0)
Monocytes Absolute: 0.5 10*3/uL (ref 0.1–1.0)
Monocytes Relative: 7 % (ref 3.0–12.0)
Neutro Abs: 2.9 10*3/uL (ref 1.4–7.7)
Neutrophils Relative %: 44.5 % (ref 43.0–77.0)
Platelets: 252 10*3/uL (ref 150.0–400.0)
RBC: 4.14 Mil/uL (ref 3.87–5.11)
RDW: 16.9 % — ABNORMAL HIGH (ref 11.5–15.5)
WBC: 6.6 10*3/uL (ref 4.0–10.5)

## 2019-05-05 LAB — BASIC METABOLIC PANEL
BUN: 16 mg/dL (ref 6–23)
CO2: 35 mEq/L — ABNORMAL HIGH (ref 19–32)
Calcium: 9.6 mg/dL (ref 8.4–10.5)
Chloride: 95 mEq/L — ABNORMAL LOW (ref 96–112)
Creatinine, Ser: 1.27 mg/dL — ABNORMAL HIGH (ref 0.40–1.20)
GFR: 52.94 mL/min — ABNORMAL LOW (ref >60.00)
Glucose, Bld: 93 mg/dL (ref 70–99)
Potassium: 3.7 mEq/L (ref 3.5–5.1)
Sodium: 136 mEq/L (ref 135–145)

## 2019-05-05 LAB — LIPID PANEL
Cholesterol: 288 mg/dL — ABNORMAL HIGH (ref 0–200)
HDL: 47.1 mg/dL (ref >39.00)
NonHDL: 240.68
Total CHOL/HDL Ratio: 6
Triglycerides: 208 mg/dL — ABNORMAL HIGH (ref 0.0–149.0)
VLDL: 41.6 mg/dL — ABNORMAL HIGH (ref 0.0–40.0)

## 2019-05-05 LAB — URINALYSIS, ROUTINE W REFLEX MICROSCOPIC
Bilirubin Urine: NEGATIVE
Ketones, ur: NEGATIVE
Nitrite: NEGATIVE
Specific Gravity, Urine: 1.01 (ref 1.000–1.030)
Total Protein, Urine: NEGATIVE
Urine Glucose: NEGATIVE
Urobilinogen, UA: 0.2 (ref 0.0–1.0)
pH: 6.5 (ref 5.0–8.0)

## 2019-05-05 LAB — HEPATIC FUNCTION PANEL
ALT: 14 U/L (ref 0–35)
AST: 25 U/L (ref 0–37)
Albumin: 4.3 g/dL (ref 3.5–5.2)
Alkaline Phosphatase: 59 U/L (ref 39–117)
Bilirubin, Direct: 0 mg/dL (ref 0.0–0.3)
Total Bilirubin: 0.4 mg/dL (ref 0.2–1.2)
Total Protein: 8.9 g/dL — ABNORMAL HIGH (ref 6.0–8.3)

## 2019-05-05 LAB — IBC PANEL
Iron: 43 ug/dL (ref 42–145)
Saturation Ratios: 9.4 % — ABNORMAL LOW (ref 20.0–50.0)
Transferrin: 327 mg/dL (ref 212.0–360.0)

## 2019-05-05 LAB — HEMOGLOBIN A1C: Hgb A1c MFr Bld: 6.3 % (ref 4.6–6.5)

## 2019-05-05 LAB — LDL CHOLESTEROL, DIRECT: Direct LDL: 222 mg/dL

## 2019-05-05 LAB — VITAMIN B12: Vitamin B-12: 877 pg/mL (ref 211–911)

## 2019-05-05 LAB — TSH: TSH: 153.84 u[IU]/mL — ABNORMAL HIGH (ref 0.35–4.50)

## 2019-05-05 NOTE — Progress Notes (Signed)
Subjective:    Patient ID: Stacy Newton, female    DOB: 08/09/1966, 53 y.o.   MRN: [id number]  HPI  Here for wellness and f/u;  Overall doing ok;  Pt denies Chest pain, worsening SOB, DOE, wheezing, orthopnea, PND, worsening LE edema, palpitations, dizziness or syncope.  Pt denies neurological change such as new headache, facial or extremity weakness.  Pt denies polydipsia, polyuria, or low sugar symptoms. Pt states overall good compliance with treatment and medications, good tolerability, and has been trying to follow appropriate diet.  Pt denies worsening depressive symptoms, suicidal ideation or panic. No fever, night sweats, wt loss, loss of appetite, or other constitutional symptoms.  Pt states good ability with ADL's, has low fall risk, home safety reviewed and adequate, no other significant changes in hearing or vision, and only occasionally active with exercise.  Due for flu shot and colonoscopy.  Pt states will call on her own to mammogram. 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  Has seen [facility] in the past, needs new referral.  Denies hyper or hypo thyroid symptoms such as voice, skin or hair change, but admits to not taking her thyroid med every day. Past Medical History:  Diagnosis Date  . Abnormal TSH 09/07/2011  . HOT FLASHES 06/25/2010  . HYPERLIPIDEMIA 06/25/2010  . HYPERTENSION 05/14/2010  . THYROMEGALY 05/14/2010  . Unspecified hypothyroidism 01/20/2014   Past Surgical History:  Procedure Laterality Date  . ABDOMINAL HYSTERECTOMY    . CESAREAN SECTION     x2  . OOPHORECTOMY     unilateral , left she thinks    reports that she has never smoked. She has never used smokeless tobacco. She reports current alcohol use. She reports that she does not use drugs. family history includes Cancer in her maternal aunt and another family member; Hypertension in her father. No Known Allergies Current Outpatient  Medications on File Prior to Visit  Medication Sig Dispense Refill  . albuterol (PROVENTIL HFA;VENTOLIN HFA) 108 (90 Base) MCG/ACT inhaler Inhale 2 puffs into the lungs every 6 (six) hours as needed for wheezing or shortness of breath. 1 Inhaler 5  . aspirin 81 MG tablet Take 160 mg by mouth.     . hydrocortisone-pramoxine (ANALPRAM HC) 2.5-1 % rectal cream Place 1 application rectally 3 (three) times daily. 30 g 3  . levothyroxine (SYNTHROID, LEVOTHROID) 112 MCG tablet TAKE 2 TABLETS BY MOUTH ONCE DAILY 180 tablet 3  . lisinopril-hydrochlorothiazide (PRINZIDE,ZESTORETIC) 20-12.5 MG tablet Take 2 tablets by mouth daily. 180 tablet 3  . triamcinolone (NASACORT) 55 MCG/ACT AERO nasal inhaler Place 2 sprays into the nose daily. 1 Inhaler 12  . cetirizine (ZYRTEC) 10 MG tablet Take 1 tablet (10 mg total) by mouth daily. 30 tablet 11   No current facility-administered medications on file prior to visit.    Review of Systems Constitutional: Negative for other unusual diaphoresis, sweats, appetite or weight changes HENT: Negative for other worsening hearing loss, ear pain, facial swelling, mouth sores or neck stiffness.   Eyes: Negative for other worsening pain, redness or other visual disturbance.  Respiratory: Negative for other stridor or swelling Cardiovascular: Negative for other palpitations or other chest pain  Gastrointestinal: Negative for worsening diarrhea or loose stools, blood in stool, distention or other pain Genitourinary: Negative for hematuria, flank pain or other change in urine volume.  Musculoskeletal: Negative for myalgias or other joint swelling.  Skin: Negative for  other color change, or other wound or worsening drainage.  Neurological: Negative for other syncope or numbness. Hematological: Negative for other adenopathy or swelling Psychiatric/Behavioral: Negative for hallucinations, other worsening agitation, SI, self-injury, or new decreased concentration All other system  neg per pt    Objective:   Physical Exam BP (!) 146/96   Pulse 68   Temp 98.6 F (37 C) (Oral)   Ht 5\' 8"  (1.727 m)   Wt 236 lb (107 kg)   SpO2 97%   BMI 35.88 kg/m  VS noted,  Constitutional: Pt is oriented to person, place, and time. Appears well-developed and well-nourished, in no significant distress and comfortable Head: Normocephalic and atraumatic  Eyes: Conjunctivae and EOM are normal. Pupils are equal, round, and reactive to light Right Ear: External ear normal without discharge Left Ear: External ear normal without discharge Nose: Nose without discharge or deformity Mouth/Throat: Oropharynx is without other ulcerations and moist  Neck: Normal range of motion. Neck supple. No JVD present. No tracheal deviation present or significant neck LA or mass Cardiovascular: Normal rate, regular rhythm, normal heart sounds and intact distal pulses.   Pulmonary/Chest: WOB normal and breath sounds without rales or wheezing  Abdominal: Soft. Bowel sounds are normal. NT. No HSM  Musculoskeletal: Normal range of motion. Exhibits no edema Lymphadenopathy: Has no other cervical adenopathy.  Neurological: Pt is alert and oriented to person, place, and time. Pt has normal reflexes. No cranial nerve deficit. Motor grossly intact, Gait intact Skin: Skin is warm and dry. No rash noted or new ulcerations except for multiple corns to both plantar feet. And hammertoe.   Psychiatric:  Has normal mood and affect. Behavior is normal without agitation No other exam findings Lab Results  Component Value Date   WBC 7.0 03/02/2018   HGB 10.7 (L) 03/02/2018   HCT 32.6 (L) 03/02/2018   PLT 256.0 03/02/2018   GLUCOSE 132 (H) 03/02/2018   CHOL 244 (H) 03/02/2018   TRIG 137.0 03/02/2018   HDL 51.30 03/02/2018   LDLDIRECT 157.3 09/06/2011   LDLCALC 166 (H) 03/02/2018   ALT 13 03/02/2018   AST 26 03/02/2018   NA 140 03/02/2018   K 3.6 03/02/2018   CL 101 03/02/2018   CREATININE 1.23 (H) 03/02/2018    BUN 13 03/02/2018   CO2 33 (H) 03/02/2018   TSH 131.78 (H) 03/02/2018   HGBA1C 6.1 03/02/2018        Assessment & Plan:

## 2019-05-05 NOTE — Assessment & Plan Note (Signed)
muliple tender, for podaitry referral  In addition to the time spent performing CPE, I spent an additional 15 minutes face to face,in which greater than 50% of this time was spent in counseling and coordination of care for patient's illness as documented, including the differential dx, treatment, further evaluation and other management of corns, hypothryoidism, renal insufficiency, hyperglycemia, HLD

## 2019-05-05 NOTE — Assessment & Plan Note (Signed)
stable overall by history and exam, recent data reviewed with pt, and pt to continue medical treatment as before,  to f/u any worsening symptoms or concerns  

## 2019-05-05 NOTE — Assessment & Plan Note (Signed)
With med non compliance some days for unclear reason, for TFTs

## 2019-05-05 NOTE — Assessment & Plan Note (Signed)
Mild last visit, for f/u lab today

## 2019-05-05 NOTE — Assessment & Plan Note (Signed)

## 2019-05-05 NOTE — Patient Instructions (Addendum)
You had the flu shot today  You will be contacted regarding the referral for: podiatry, and colonoscopy  Please call for your mammogram as you mentioned  Please continue all other medications as before, and refills have been done if requested.  Please have the pharmacy call with any other refills you may need.  Please continue your efforts at being more active, low cholesterol diet, and weight control.  You are otherwise up to date with prevention measures today.  Please keep your appointments with your specialists as you may have planned  Please go to the LAB in the Basement (turn left off the elevator) for the tests to be done today  You will be contacted by phone if any changes need to be made immediately.  Otherwise, you will receive a letter about your results with an explanation, but please check with MyChart first.  Please remember to sign up for MyChart if you have not done so, as this will be important to you in the future with finding out test results, communicating by private email, and scheduling acute appointments online when needed.  Please return in 1 year for your yearly visit, or sooner if needed, with Lab testing done 3-5 days before

## 2019-05-06 ENCOUNTER — Ambulatory Visit (INDEPENDENT_AMBULATORY_CARE_PROVIDER_SITE_OTHER): Payer: Federal, State, Local not specified - PPO

## 2019-05-06 ENCOUNTER — Ambulatory Visit (INDEPENDENT_AMBULATORY_CARE_PROVIDER_SITE_OTHER): Payer: Federal, State, Local not specified - PPO | Admitting: Podiatry

## 2019-05-06 ENCOUNTER — Encounter: Payer: Self-pay | Admitting: Podiatry

## 2019-05-06 ENCOUNTER — Other Ambulatory Visit: Payer: Self-pay | Admitting: Internal Medicine

## 2019-05-06 ENCOUNTER — Telehealth: Payer: Self-pay

## 2019-05-06 VITALS — BP 178/110 | HR 69 | Resp 16

## 2019-05-06 DIAGNOSIS — M2041 Other hammer toe(s) (acquired), right foot: Secondary | ICD-10-CM | POA: Diagnosis not present

## 2019-05-06 DIAGNOSIS — B070 Plantar wart: Secondary | ICD-10-CM | POA: Diagnosis not present

## 2019-05-06 DIAGNOSIS — M2042 Other hammer toe(s) (acquired), left foot: Secondary | ICD-10-CM | POA: Diagnosis not present

## 2019-05-06 LAB — HIV ANTIBODY (ROUTINE TESTING W REFLEX): HIV 1&2 Ab, 4th Generation: NONREACTIVE

## 2019-05-06 LAB — VITAMIN D 25 HYDROXY (VIT D DEFICIENCY, FRACTURES): VITD: 7.03 ng/mL — ABNORMAL LOW (ref 30.00–100.00)

## 2019-05-06 MED ORDER — VITAMIN D (ERGOCALCIFEROL) 1.25 MG (50000 UNIT) PO CAPS: 50000.0000 [IU] | ORAL_CAPSULE | ORAL | 0 refills | Status: DC

## 2019-05-06 MED ORDER — LEVOTHYROXINE SODIUM 112 MCG PO TABS: 224.0000 ug | ORAL_TABLET | Freq: Every day | ORAL | 3 refills | Status: DC

## 2019-05-06 NOTE — Telephone Encounter (Signed)
Pt has been informed of results and expressed understanding.  °

## 2019-05-06 NOTE — Patient Instructions (Signed)
Hammer Toe  Hammer toe is a change in the shape (a deformity) of your toe. The deformity causes the middle joint of your toe to stay bent. This causes pain, especially when you are wearing shoes. Hammer toe starts gradually. At first, the toe can be straightened. Gradually over time, the deformity becomes stiff and permanent. Early treatments to keep the toe straight may relieve pain. As the deformity becomes stiff and permanent, surgery may be needed to straighten the toe. What are the causes? Hammer toe is caused by abnormal bending of the toe joint that is closest to your foot. It happens gradually over time. This pulls on the muscles and connections (tendons) of the toe joint, making them weak and stiff. It is often related to wearing shoes that are too short or narrow and do not let your toes straighten. What increases the risk? You may be at greater risk for hammer toe if you:  Are female.  Are older.  Wear shoes that are too small.  Wear high-heeled shoes that pinch your toes.  Are a ballet dancer.  Have a second toe that is longer than your big toe (first toe).  Injure your foot or toe.  Have arthritis.  Have a family history of hammer toe.  Have a nerve or muscle disorder. What are the signs or symptoms? The main symptoms of this condition are pain and deformity of the toe. The pain is worse when wearing shoes, walking, or running. Other symptoms may include:  Corns or calluses over the bent part of the toe or between the toes.  Redness and a burning feeling on the toe.  An open sore that forms on the top of the toe.  Not being able to straighten the toe. How is this diagnosed? This condition is diagnosed based on your symptoms and a physical exam. During the exam, your health care provider will try to straighten your toe to see how stiff the deformity is. You may also have tests, such as:  A blood test to check for rheumatoid arthritis.  An X-ray to show how  severe the deformity is. How is this treated? Treatment for this condition will depend on how stiff the deformity is. Surgery is often needed. However, sometimes a hammer toe can be straightened without surgery. Treatments that do not involve surgery include:  Taping the toe into a straightened position.  Using pads and cushions to protect the toe (orthotics).  Wearing shoes that provide enough room for the toes.  Doing toe-stretching exercises at home.  Taking an NSAID to reduce pain and swelling. If these treatments do not help or the toe cannot be straightened, surgery is the next option. The most common surgeries used to straighten a hammer toe include:  Arthroplasty. In this procedure, part of the joint is removed, and that allows the toe to straighten.  Fusion. In this procedure, cartilage between the two bones of the joint is taken out and the bones are fused together into one longer bone.  Implantation. In this procedure, part of the bone is removed and replaced with an implant to let the toe move again.  Flexor tendon transfer. In this procedure, the tendons that curl the toes down (flexor tendons) are repositioned. Follow these instructions at home:  Take over-the-counter and prescription medicines only as told by your health care provider.  Do toe straightening and stretching exercises as told by your health care provider.  Keep all follow-up visits as told by your health care   provider. This is important. How is this prevented?  Wear shoes that give your toes enough room and do not cause pain.  Do not wear high-heeled shoes. Contact a health care provider if:  Your pain gets worse.  Your toe becomes red or swollen.  You develop an open sore on your toe. This information is not intended to replace advice given to you by your health care provider. Make sure you discuss any questions you have with your health care provider. Document Released: 08/09/2000 Document  Revised: 07/25/2017 Document Reviewed: 12/06/2015 Elsevier Patient Education  2020 [employer] Inc.  

## 2019-05-06 NOTE — Telephone Encounter (Signed)
-----   Message from Biagio Borg, MD sent at 05/06/2019 12:58 PM EDT ----- Left message on MyChart, pt to cont same tx except  The test results show that your current treatment is OK, as the tests are stable except the Vitamin D level is VERY low, and the Thyroid test shows SEVERE low thyroid condition  We need to: 1) Please take Vitamin D 50000 units weekly for 12 weeks, then plan to change to OTC Vitamin D3 at 2000 units per day, indefinitely. 2)  Please take all thyroid medication you already have.    Shirron to please inform pt, I will do rx x 2

## 2019-05-06 NOTE — Progress Notes (Signed)
   Subjective:    Patient ID: Stacy Newton, female    DOB: [birth year], 53 y.o.   MRN: [id number]  HPI    Review of Systems  All other systems reviewed and are negative.      Objective:   Physical Exam        Assessment & Plan:

## 2019-05-07 NOTE — Progress Notes (Signed)
Subjective:   Patient ID: Stacy Newton, female   DOB: 53 y.o.   MRN: [id number]   HPI Patient presents stating that she has a lot of pain in both her feet and she has lesions that are very painful and make it hard for her to walk.  Patient also states she has family history of this and it is been an ongoing issue.  Patient does not smoke currently likes to be active.  Patient states her pain is been present for years and has worsened recently   Review of Systems  All other systems reviewed and are negative.       Objective:  Physical Exam Vitals signs and nursing note reviewed.  Constitutional:      Appearance: She is well-developed.  Pulmonary:     Effort: Pulmonary effort is normal.  Musculoskeletal: Normal range of motion.  Skin:    General: Skin is warm.  Neurological:     Mental Status: She is alert.     Neurovascular status found to be intact muscle strength found to be adequate range of motion was within normal limits with severe lesion second and fifth metatarsal right with pinpoint bleeding noted upon debridement and also lesions on the left which become painful.  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  Discussed possibility for orthotics to try to take pressure off her feet and we will see her back to reevaluate and decide what else may be of help to her and see how she responds to conservative  X-rays indicate there is mild hammertoe deformity and structural deformity noted bilateral

## 2019-06-18 ENCOUNTER — Encounter: Payer: Self-pay | Admitting: Internal Medicine

## 2019-07-28 ENCOUNTER — Ambulatory Visit: Payer: Federal, State, Local not specified - PPO | Admitting: Cardiovascular Disease

## 2019-07-28 NOTE — Progress Notes (Deleted)
Hypertension Clinic Initial Assessment:    Date:  07/28/2019   ID:  Stacy Newton, DOB 08/27/1965, MRN [id number]  PCP:  Corwin Levins, MD  Cardiologist:  No primary care provider on file.  Nephrologist:  Referring MD: Corwin Levins, MD   CC: Hypertension  History of Present Illness:    Stacy Newton is a 53 y.o. female with a hx of hypertension, hyperlipidemia,, here to establish care in the hypertension clinic.    Previous antihypertensives:  Past Medical History:  Diagnosis Date  . Abnormal TSH 09/07/2011  . HOT FLASHES 06/25/2010  . HYPERLIPIDEMIA 06/25/2010  . HYPERTENSION 05/14/2010  . THYROMEGALY 05/14/2010  . Unspecified hypothyroidism 01/20/2014    Past Surgical History:  Procedure Laterality Date  . ABDOMINAL HYSTERECTOMY    . CESAREAN SECTION     x2  . OOPHORECTOMY     unilateral , left she thinks    Current Medications: No outpatient medications have been marked as taking for the 07/28/19 encounter (Appointment) with Chilton Si, MD.     Allergies:   Patient has no known allergies.   Social History   Socioeconomic History  . Marital status: Married    Spouse name: Not on file  . Number of children: 2  . Years of education: Not on file  . Highest education level: Not on file  Occupational History  . Occupation: [employer] engineer  Social Needs  . Financial resource strain: Not on file  . Food insecurity    Worry: Not on file    Inability: Not on file  . Transportation needs    Medical: Not on file    Non-medical: Not on file  Tobacco Use  . Smoking status: Never Smoker  . Smokeless tobacco: Never Used  Substance and Sexual Activity  . Alcohol use: Yes    Comment: social, rare  . Drug use: No  . Sexual activity: Yes    Partners: Male  Lifestyle  . Physical activity    Days per week: Not on file    Minutes per session: Not on file  . Stress: Not on file  Relationships  . Social Musician on phone:  Not on file    Gets together: Not on file    Attends religious service: Not on file    Active member of club or organization: Not on file    Attends meetings of clubs or organizations: Not on file    Relationship status: Not on file  Other Topics Concern  . Not on file  Social History Narrative  . Not on file     Family History: The patient's ***family history includes Cancer in her maternal aunt and another family member; Hypertension in her father. There is no history of Thyroid disease.  ROS:   Please see the history of present illness.    *** All other systems reviewed and are negative.  EKGs/Labs/Other Studies Reviewed:    EKG:  EKG is *** ordered today.  The ekg ordered today demonstrates ***  Recent Labs: 05/05/2019: ALT 14; BUN 16; Creatinine, Ser 1.27; Hemoglobin 11.6; Platelets 252.0; Potassium 3.7; Sodium 136; TSH 153.84   Recent Lipid Panel    Component Value Date/Time   CHOL 288 (H) 05/05/2019 1552   TRIG 208.0 (H) 05/05/2019 1552   HDL 47.10 05/05/2019 1552   CHOLHDL 6 05/05/2019 1552   VLDL 41.6 (H) 05/05/2019 1552   LDLCALC 166 (H) 03/02/2018 1556   LDLDIRECT 222.0 05/05/2019  1552    Physical Exam:    VS:  There were no vitals taken for this visit.    Wt Readings from Last 3 Encounters:  05/05/19 236 lb (107 kg)  03/02/18 234 lb (106.1 kg)  08/18/17 224 lb (101.6 kg)     GEN: *** Well nourished, well developed in no acute distress HEENT: Normal NECK: No JVD; No carotid bruits LYMPHATICS: No lymphadenopathy CARDIAC: ***RRR, no murmurs, rubs, gallops RESPIRATORY:  Clear to auscultation without rales, wheezing or rhonchi  ABDOMEN: Soft, non-tender, non-distended MUSCULOSKELETAL:  No edema; No deformity  SKIN: Warm and dry NEUROLOGIC:  Alert and oriented x 3 PSYCHIATRIC:  Normal affect   ASSESSMENT:    No diagnosis found.  PLAN:    1. ***   Disposition:    FU with MD/PharmD in {gen number [id number]} {Days to years:10300} {virtual or  in-office}   Medication Adjustments/Labs and Tests Ordered: Current medicines are reviewed at length with the patient today.  Concerns regarding medicines are outlined above.  No orders of the defined types were placed in this encounter.  No orders of the defined types were placed in this encounter.    Signed, Skeet Latch, MD  07/28/2019 6:14 AM    [place]

## 2019-09-16 ENCOUNTER — Ambulatory Visit: Payer: Federal, State, Local not specified - PPO | Admitting: Cardiovascular Disease

## 2019-11-20 ENCOUNTER — Other Ambulatory Visit: Payer: Self-pay | Admitting: Internal Medicine

## 2019-11-20 NOTE — Telephone Encounter (Signed)
Please refill as per office routine med refill policy (all routine meds refilled for 3 mo or monthly per pt preference up to one year from last visit, then month to month grace period for 3 mo, then further med refills will have to be denied)  

## 2019-12-30 ENCOUNTER — Other Ambulatory Visit: Payer: Self-pay | Admitting: Internal Medicine

## 2019-12-30 DIAGNOSIS — Z1231 Encounter for screening mammogram for malignant neoplasm of breast: Secondary | ICD-10-CM

## 2020-01-07 ENCOUNTER — Other Ambulatory Visit: Payer: Self-pay

## 2020-01-07 ENCOUNTER — Ambulatory Visit
Admission: RE | Admit: 2020-01-07 | Discharge: 2020-01-07 | Disposition: A | Discharge status: Home / Self Care | Payer: Federal, State, Local not specified - PPO | Source: Ambulatory Visit | Attending: Internal Medicine | Admitting: Internal Medicine

## 2020-01-07 DIAGNOSIS — Z1231 Encounter for screening mammogram for malignant neoplasm of breast: Secondary | ICD-10-CM

## 2020-05-08 ENCOUNTER — Encounter: Payer: Federal, State, Local not specified - PPO | Admitting: Internal Medicine

## 2020-06-17 ENCOUNTER — Other Ambulatory Visit: Payer: Self-pay | Admitting: Internal Medicine

## 2020-06-17 NOTE — Telephone Encounter (Signed)
Please refill as per office routine med refill policy (all routine meds refilled for 3 mo or monthly per pt preference up to one year from last visit, then month to month grace period for 3 mo, then further med refills will have to be denied)  

## 2020-09-21 DIAGNOSIS — H40033 Anatomical narrow angle, bilateral: Secondary | ICD-10-CM | POA: Diagnosis not present

## 2020-09-21 DIAGNOSIS — H04123 Dry eye syndrome of bilateral lacrimal glands: Secondary | ICD-10-CM | POA: Diagnosis not present

## 2021-02-19 ENCOUNTER — Encounter: Payer: Self-pay | Admitting: Internal Medicine

## 2021-02-19 ENCOUNTER — Other Ambulatory Visit: Payer: Self-pay

## 2021-02-19 ENCOUNTER — Ambulatory Visit: Payer: Federal, State, Local not specified - PPO | Admitting: Internal Medicine

## 2021-02-19 VITALS — BP 188/110 | HR 69 | Temp 98.6°F | Ht 68.0 in | Wt 209.0 lb

## 2021-02-19 DIAGNOSIS — E785 Hyperlipidemia, unspecified: Secondary | ICD-10-CM | POA: Diagnosis not present

## 2021-02-19 DIAGNOSIS — E559 Vitamin D deficiency, unspecified: Secondary | ICD-10-CM

## 2021-02-19 DIAGNOSIS — Z0001 Encounter for general adult medical examination with abnormal findings: Secondary | ICD-10-CM

## 2021-02-19 DIAGNOSIS — E538 Deficiency of other specified B group vitamins: Secondary | ICD-10-CM

## 2021-02-19 DIAGNOSIS — Z1159 Encounter for screening for other viral diseases: Secondary | ICD-10-CM

## 2021-02-19 DIAGNOSIS — E038 Other specified hypothyroidism: Secondary | ICD-10-CM

## 2021-02-19 DIAGNOSIS — Z1211 Encounter for screening for malignant neoplasm of colon: Secondary | ICD-10-CM | POA: Diagnosis not present

## 2021-02-19 DIAGNOSIS — R739 Hyperglycemia, unspecified: Secondary | ICD-10-CM

## 2021-02-19 DIAGNOSIS — I10 Essential (primary) hypertension: Secondary | ICD-10-CM

## 2021-02-19 DIAGNOSIS — E063 Autoimmune thyroiditis: Secondary | ICD-10-CM

## 2021-02-19 MED ORDER — LISINOPRIL-HYDROCHLOROTHIAZIDE 20-12.5 MG PO TABS
2.0000 | ORAL_TABLET | Freq: Every day | ORAL | 3 refills | Status: DC
Start: 2021-02-19 — End: 2023-04-18

## 2021-02-19 MED ORDER — LEVOTHYROXINE SODIUM 112 MCG PO TABS
224.0000 ug | ORAL_TABLET | Freq: Every day | ORAL | 3 refills | Status: DC
Start: 2021-02-19 — End: 2021-02-21

## 2021-02-19 NOTE — Progress Notes (Signed)
Patient ID: Stacy Newton, female   DOB: 15-Jun-1966, 55 y.o.   MRN: [id number]         Chief Complaint:: wellness exam and htn uncontrolled, hypothyroid, hyperglycemia, hld, vit d deficiency       HPI:  Stacy Newton is a 55 y.o. female here for wellness exam; declines colonoscopy;covid booster, shingrix,  for hep c screen , o/w up to date with preventive referrals and immunizations.                          Also has been out of BP med for several weeks but states has been compliant with thyroid replacement.  Pt denies chest pain, increased sob or doe, wheezing, orthopnea, PND, increased LE swelling, palpitations, dizziness or syncope.   Pt denies polydipsia, polyuria, or new focal neuro s/s.   Pt denies fever, wt loss, night sweats, loss of appetite, or other constitutional symptoms  Did have covid infection jan 2022 and still has not recovered loss of taste and smell.  No other complaints Wt coming down with better diet  and walking.   Wt Readings from Last 3 Encounters:  02/19/21 209 lb (94.8 kg)  05/05/19 236 lb (107 kg)  03/02/18 234 lb (106.1 kg)   BP Readings from Last 3 Encounters:  02/19/21 (!) 188/110  05/06/19 (!) 178/110  05/05/19 (!) 146/96   Immunization History  Administered Date(s) Administered   Influenza Split 09/06/2011, 06/18/2012   Influenza Whole 05/14/2010   Influenza, Seasonal, Injecte, Preservative Fre 05/26/2014   Influenza,inj,Quad PF,6+ Mos 05/05/2019   PFIZER(Purple Top)SARS-COV-2 Vaccination 10/23/2019, 11/13/2019   Tdap 09/13/2016   Health Maintenance Due  Topic Date Due   Hepatitis C Screening  Never done      Past Medical History:  Diagnosis Date   Abnormal TSH 09/07/2011   HOT FLASHES 06/25/2010   HYPERLIPIDEMIA 06/25/2010   HYPERTENSION 05/14/2010   THYROMEGALY 05/14/2010   Unspecified hypothyroidism 01/20/2014   Past Surgical History:  Procedure Laterality Date   ABDOMINAL HYSTERECTOMY     CESAREAN SECTION     x2    OOPHORECTOMY     unilateral , left she thinks    reports that she has never smoked. She has never used smokeless tobacco. She reports current alcohol use. She reports that she does not use drugs. family history includes Cancer in her maternal aunt and another family member; Hypertension in her father. No Known Allergies Current Outpatient Medications on File Prior to Visit  Medication Sig Dispense Refill   albuterol (PROVENTIL HFA;VENTOLIN HFA) 108 (90 Base) MCG/ACT inhaler Inhale 2 puffs into the lungs every 6 (six) hours as needed for wheezing or shortness of breath. 1 Inhaler 5   triamcinolone (NASACORT) 55 MCG/ACT AERO nasal inhaler Place 2 sprays into the nose daily. 1 Inhaler 12   aspirin 81 MG tablet Take 160 mg by mouth.  (Patient not taking: Reported on 02/19/2021)     cetirizine (ZYRTEC) 10 MG tablet Take 1 tablet (10 mg total) by mouth daily. 30 tablet 11   No current facility-administered medications on file prior to visit.        ROS:  All others reviewed and negative.  Objective        PE:  BP (!) 188/110   Pulse 69   Temp 98.6 F (37 C) (Oral)   Ht 5\' 8"  (1.727 m)   Wt 209 lb (94.8 kg)   BMI 31.78 kg/m  Constitutional: Pt appears in NAD               HENT: Head: NCAT.                Right Ear: External ear normal.                 Left Ear: External ear normal.                Eyes: . Pupils are equal, round, and reactive to light. Conjunctivae and EOM are normal               Nose: without d/c or deformity               Neck: Neck supple. Gross normal ROM               Cardiovascular: Normal rate and regular rhythm.                 Pulmonary/Chest: Effort normal and breath sounds without rales or wheezing.                Abd:  Soft, NT, ND, + BS, no organomegaly               Neurological: Pt is alert. At baseline orientation, motor grossly intact               Skin: Skin is warm. No rashes, no other new lesions, LE edema - none                Psychiatric: Pt behavior is normal without agitation   Micro: none  Cardiac tracings I have personally interpreted today:  none  Pertinent Radiological findings (summarize): none   Lab Results  Component Value Date   WBC 6.6 05/05/2019   HGB 11.6 (L) 05/05/2019   HCT 35.6 (L) 05/05/2019   PLT 252.0 05/05/2019   GLUCOSE 93 05/05/2019   CHOL 288 (H) 05/05/2019   TRIG 208.0 (H) 05/05/2019   HDL 47.10 05/05/2019   LDLDIRECT 222.0 05/05/2019   LDLCALC 166 (H) 03/02/2018   ALT 14 05/05/2019   AST 25 05/05/2019   NA 136 05/05/2019   K 3.7 05/05/2019   CL 95 (L) 05/05/2019   CREATININE 1.27 (H) 05/05/2019   BUN 16 05/05/2019   CO2 35 (H) 05/05/2019   TSH 153.84 (H) 05/05/2019   HGBA1C 6.3 05/05/2019   Assessment/Plan:  Stacy Newton is a 55 y.o. Black or African American [2] female with  has a past medical history of Abnormal TSH (09/07/2011), HOT FLASHES (06/25/2010), HYPERLIPIDEMIA (06/25/2010), HYPERTENSION (05/14/2010), THYROMEGALY (05/14/2010), and Unspecified hypothyroidism (01/20/2014).  Vitamin D deficiency Last vitamin D Lab Results  Component Value Date   VD25OH 7.03 (L) 05/05/2019   Very low, to start oral replacement   Encounter for well adult exam with abnormal findings Age and sex appropriate education and counseling updated with regular exercise and diet Referrals for preventative services - declines colonoscopy, for hep c screen Immunizations addressed - declines covid booster, shingrix Smoking counseling  - none needed Evidence for depression or other mood disorder - none significant Most recent labs reviewed. I have personally reviewed and have noted: 1) the patient's medical and social history 2) The patient's current medications and supplements 3) The patient's height, weight, and BMI have been recorded in the chart   Essential hypertension BP Readings from Last 3 Encounters:  02/19/21 (!) 188/110  05/06/19 (!) 178/110  05/05/19 (!) 146/96  Very severe uncontrolled, pt to restart medical treatment , after 1 wk, to check BP at home and call with average at 2 wks   Hyperglycemia Lab Results  Component Value Date   HGBA1C 6.3 05/05/2019   Stable, pt to continue current medical treatment  - diet   Hyperlipidemia Lab Results  Component Value Date   LDLCALC 166 (H) 03/02/2018   Hx of severe uncontrolled, cont low chol diet, consider statin for ldl > 100, check lipids today   Hypothyroidism Lab Results  Component Value Date   TSH 153.84 (H) 05/05/2019   Hx of non compliacne, pt to continue current levothyroxine with recent compliance, and recheck lab today  Followup: Return in about 6 months (around 08/21/2021).  Oliver Barre, MD 02/19/2021 8:33 PM [facility]

## 2021-02-19 NOTE — Assessment & Plan Note (Signed)
BP Readings from Last 3 Encounters:  02/19/21 (!) 188/110  05/06/19 (!) 178/110  05/05/19 (!) 146/96   Very severe uncontrolled, pt to restart medical treatment , after 1 wk, to check BP at home and call with average at 2 wks

## 2021-02-19 NOTE — Assessment & Plan Note (Signed)
Lab Results  Component Value Date   LDLCALC 166 (H) 03/02/2018   Hx of severe uncontrolled, cont low chol diet, consider statin for ldl > 100, check lipids today

## 2021-02-19 NOTE — Assessment & Plan Note (Signed)
Lab Results  Component Value Date   TSH 153.84 (H) 05/05/2019   Hx of non compliacne, pt to continue current levothyroxine with recent compliance, and recheck lab today

## 2021-02-19 NOTE — Assessment & Plan Note (Signed)
Age and sex appropriate education and counseling updated with regular exercise and diet Referrals for preventative services - declines colonoscopy, for hep c screen Immunizations addressed - declines covid booster, shingrix Smoking counseling  - none needed Evidence for depression or other mood disorder - none significant Most recent labs reviewed. I have personally reviewed and have noted: 1) the patient's medical and social history 2) The patient's current medications and supplements 3) The patient's height, weight, and BMI have been recorded in the chart

## 2021-02-19 NOTE — Assessment & Plan Note (Signed)
Last vitamin D Lab Results  Component Value Date   VD25OH 7.03 (L) 05/05/2019   Very low, to start oral replacement

## 2021-02-19 NOTE — Assessment & Plan Note (Signed)
Lab Results  Component Value Date   HGBA1C 6.3 05/05/2019   Stable, pt to continue current medical treatment  - diet

## 2021-02-19 NOTE — Patient Instructions (Signed)
Please take OTC Vitamin D3 at 2000 units per day, indefinitely, as your Vitamin D level was low last time  Please continue all other medications as before, and refills have been done if requested for the BP and the thyroid medicatons  In 1 week please check your BP every day for 14 days and let us know if you are over 140/90 as we could consider adding other medication such as amlodipine  Please have the pharmacy call with any other refills you may need.  Please continue your efforts at being more active, low cholesterol diet, and weight control.  You are otherwise up to date with prevention measures today.  Please keep your appointments with your specialists as you may have planned  Please go to the LAB at the blood drawing area for the tests to be done  You will be contacted by phone if any changes need to be made immediately.  Otherwise, you will receive a letter about your results with an explanation, but please check with MyChart first.  Please remember to sign up for MyChart if you have not done so, as this will be important to you in the future with finding out test results, communicating by private email, and scheduling acute appointments online when needed.  Please make an Appointment to return in 6 months, or sooner if needed

## 2021-02-20 LAB — LIPID PANEL
Cholesterol: 293 mg/dL — ABNORMAL HIGH (ref 0–200)
HDL: 54.5 mg/dL (ref >39.00)
LDL Cholesterol: 213 mg/dL — ABNORMAL HIGH (ref 0–99)
NonHDL: 238.16
Total CHOL/HDL Ratio: 5
Triglycerides: 127 mg/dL (ref 0.0–149.0)
VLDL: 25.4 mg/dL (ref 0.0–40.0)

## 2021-02-20 LAB — URINALYSIS, ROUTINE W REFLEX MICROSCOPIC
Bilirubin Urine: NEGATIVE
Ketones, ur: NEGATIVE
Nitrite: NEGATIVE
Specific Gravity, Urine: 1.015 (ref 1.000–1.030)
Urine Glucose: NEGATIVE
Urobilinogen, UA: 0.2 (ref 0.0–1.0)
pH: 7 (ref 5.0–8.0)

## 2021-02-20 LAB — BASIC METABOLIC PANEL
BUN: 15 mg/dL (ref 6–23)
CO2: 35 mEq/L — ABNORMAL HIGH (ref 19–32)
Calcium: 10.1 mg/dL (ref 8.4–10.5)
Chloride: 97 mEq/L (ref 96–112)
Creatinine, Ser: 1.19 mg/dL (ref 0.40–1.20)
GFR: 51.16 mL/min — ABNORMAL LOW (ref >60.00)
Glucose, Bld: 96 mg/dL (ref 70–99)
Potassium: 4 mEq/L (ref 3.5–5.1)
Sodium: 138 mEq/L (ref 135–145)

## 2021-02-20 LAB — CBC WITH DIFFERENTIAL/PLATELET
Basophils Absolute: 0.1 10*3/uL (ref 0.0–0.1)
Basophils Relative: 1.3 % (ref 0.0–3.0)
Eosinophils Absolute: 0.1 10*3/uL (ref 0.0–0.7)
Eosinophils Relative: 1.8 % (ref 0.0–5.0)
HCT: 35.9 % — ABNORMAL LOW (ref 36.0–46.0)
Hemoglobin: 12 g/dL (ref 12.0–15.0)
Lymphocytes Relative: 52.5 % — ABNORMAL HIGH (ref 12.0–46.0)
Lymphs Abs: 2.7 10*3/uL (ref 0.7–4.0)
MCHC: 33.4 g/dL (ref 30.0–36.0)
MCV: 85.6 fl (ref 78.0–100.0)
Monocytes Absolute: 0.4 10*3/uL (ref 0.1–1.0)
Monocytes Relative: 8.6 % (ref 3.0–12.0)
Neutro Abs: 1.9 10*3/uL (ref 1.4–7.7)
Neutrophils Relative %: 35.8 % — ABNORMAL LOW (ref 43.0–77.0)
Platelets: 233 10*3/uL (ref 150.0–400.0)
RBC: 4.2 Mil/uL (ref 3.87–5.11)
RDW: 16.2 % — ABNORMAL HIGH (ref 11.5–15.5)
WBC: 5.2 10*3/uL (ref 4.0–10.5)

## 2021-02-20 LAB — HEPATIC FUNCTION PANEL
ALT: 16 U/L (ref 0–35)
AST: 31 U/L (ref 0–37)
Albumin: 4.5 g/dL (ref 3.5–5.2)
Alkaline Phosphatase: 49 U/L (ref 39–117)
Bilirubin, Direct: 0 mg/dL (ref 0.0–0.3)
Total Bilirubin: 0.4 mg/dL (ref 0.2–1.2)
Total Protein: 8.6 g/dL — ABNORMAL HIGH (ref 6.0–8.3)

## 2021-02-20 LAB — VITAMIN D 25 HYDROXY (VIT D DEFICIENCY, FRACTURES): VITD: 11.8 ng/mL — ABNORMAL LOW (ref 30.00–100.00)

## 2021-02-20 LAB — VITAMIN B12: Vitamin B-12: 820 pg/mL (ref 211–911)

## 2021-02-20 LAB — HEPATITIS C ANTIBODY
Hepatitis C Ab: NONREACTIVE
SIGNAL TO CUT-OFF: 0.04 (ref <1.00)

## 2021-02-20 LAB — TSH: TSH: 114.17 u[IU]/mL — ABNORMAL HIGH (ref 0.35–4.50)

## 2021-02-21 ENCOUNTER — Encounter: Payer: Self-pay | Admitting: Internal Medicine

## 2021-02-21 ENCOUNTER — Other Ambulatory Visit: Payer: Self-pay | Admitting: Internal Medicine

## 2021-02-21 DIAGNOSIS — E038 Other specified hypothyroidism: Secondary | ICD-10-CM

## 2021-02-21 DIAGNOSIS — E785 Hyperlipidemia, unspecified: Secondary | ICD-10-CM

## 2021-02-21 MED ORDER — ROSUVASTATIN CALCIUM 20 MG PO TABS
20.0000 mg | ORAL_TABLET | Freq: Every day | ORAL | 3 refills | Status: DC
Start: 2021-02-21 — End: 2023-04-18

## 2021-02-21 MED ORDER — LEVOTHYROXINE SODIUM 125 MCG PO TABS: 250.0000 ug | ORAL_TABLET | Freq: Every day | ORAL | 3 refills | Status: DC

## 2021-02-21 MED ORDER — CHOLECALCIFEROL 50 MCG (2000 UT) PO TABS: ORAL_TABLET | ORAL | 99 refills | Status: AC

## 2021-11-21 ENCOUNTER — Telehealth: Payer: Self-pay

## 2021-11-21 NOTE — Telephone Encounter (Signed)
? ?  Hypertension Outreach Note  ?Pharmacy Note ? ?Spoke with patient, confirms that she has been able to restart lisinopril-hctz since last appointment where BP reading was elevated - has been monitoring at home, reports BP typically <140/90 ? ? ?BP Readings from Last 3 Encounters:  ?02/19/21 (!) 188/110  ?05/06/19 (!) 178/110  ?05/05/19 (!) 146/96  ? ?Pulse Readings from Last 3 Encounters:  ?02/19/21 69  ?05/06/19 69  ?05/05/19 68  ? ? ?Hypertension (BP goal <140/90) ?-Uncontrolled ?-Current treatment: ?Lisinopril-hctz 20-12.5mg  - 2 tablets daily  ?-Medications previously tried: n/a  ?-Current home readings: reports to checking blood pressure 2x / weeks  ?-Current dietary habits: reports that she has been trying to watch her diet and reduce her sodium intake - does not to drinking coffee throughout the day  ?-Current exercise habits: active at work - works at [employer]  ?-Denies hypotensive/hypertensive symptoms ?-Educated on BP goals and benefits of medications for prevention of heart attack, stroke and kidney damage; ?Daily salt intake goal < 2300 mg; ?Exercise goal of 150 minutes per week; ?Importance of home blood pressure monitoring; ?Proper BP monitoring technique; ?Symptoms of hypotension and importance of maintaining adequate hydration; ?-Counseled to monitor BP at home 2-3 times weekly, document, and provide log at future appointments ?-Educated on importance of blood pressure control - reviewed with patient blood pressure goals - will continue to monitor blood pressure and reach out should BP average >140/90 ?-Patient was given office phone number to call with any issues or concerns  ? ?Ellin Saba, PharmD ?Clinical Pharmacist, [facility]  ? ? ?

## 2023-04-07 ENCOUNTER — Ambulatory Visit: Payer: Federal, State, Local not specified - PPO | Admitting: Internal Medicine

## 2023-04-08 ENCOUNTER — Ambulatory Visit: Payer: Federal, State, Local not specified - PPO | Admitting: Internal Medicine

## 2023-04-15 ENCOUNTER — Ambulatory Visit: Payer: Federal, State, Local not specified - PPO | Admitting: Internal Medicine

## 2023-04-18 ENCOUNTER — Encounter: Payer: Self-pay | Admitting: Internal Medicine

## 2023-04-18 ENCOUNTER — Ambulatory Visit: Payer: Federal, State, Local not specified - PPO | Admitting: Internal Medicine

## 2023-04-18 VITALS — BP 160/94 | HR 65 | Temp 98.1°F | Ht 68.0 in | Wt 205.0 lb

## 2023-04-18 DIAGNOSIS — R739 Hyperglycemia, unspecified: Secondary | ICD-10-CM

## 2023-04-18 DIAGNOSIS — E785 Hyperlipidemia, unspecified: Secondary | ICD-10-CM | POA: Diagnosis not present

## 2023-04-18 DIAGNOSIS — I10 Essential (primary) hypertension: Secondary | ICD-10-CM | POA: Diagnosis not present

## 2023-04-18 DIAGNOSIS — E038 Other specified hypothyroidism: Secondary | ICD-10-CM

## 2023-04-18 DIAGNOSIS — E559 Vitamin D deficiency, unspecified: Secondary | ICD-10-CM

## 2023-04-18 DIAGNOSIS — Z0001 Encounter for general adult medical examination with abnormal findings: Secondary | ICD-10-CM

## 2023-04-18 DIAGNOSIS — E538 Deficiency of other specified B group vitamins: Secondary | ICD-10-CM | POA: Diagnosis not present

## 2023-04-18 DIAGNOSIS — E063 Autoimmune thyroiditis: Secondary | ICD-10-CM

## 2023-04-18 LAB — LIPID PANEL
Cholesterol: 345 mg/dL — ABNORMAL HIGH (ref 0–200)
HDL: 51.4 mg/dL (ref >39.00)
LDL Cholesterol: 259 mg/dL — ABNORMAL HIGH (ref 0–99)
NonHDL: 293.33
Total CHOL/HDL Ratio: 7
Triglycerides: 171 mg/dL — ABNORMAL HIGH (ref 0.0–149.0)
VLDL: 34.2 mg/dL (ref 0.0–40.0)

## 2023-04-18 LAB — MICROALBUMIN / CREATININE URINE RATIO
Creatinine,U: 191.3 mg/dL
Microalb Creat Ratio: 11.9 mg/g (ref 0.0–30.0)
Microalb, Ur: 22.7 mg/dL — ABNORMAL HIGH (ref 0.0–1.9)

## 2023-04-18 LAB — CBC WITH DIFFERENTIAL/PLATELET
Basophils Absolute: 0 10*3/uL (ref 0.0–0.1)
Basophils Relative: 0.7 % (ref 0.0–3.0)
Eosinophils Absolute: 0.1 10*3/uL (ref 0.0–0.7)
Eosinophils Relative: 1.3 % (ref 0.0–5.0)
HCT: 35.3 % — ABNORMAL LOW (ref 36.0–46.0)
Hemoglobin: 11.1 g/dL — ABNORMAL LOW (ref 12.0–15.0)
Lymphocytes Relative: 42.1 % (ref 12.0–46.0)
Lymphs Abs: 2.1 10*3/uL (ref 0.7–4.0)
MCHC: 31.4 g/dL (ref 30.0–36.0)
MCV: 89.3 fl (ref 78.0–100.0)
Monocytes Absolute: 0.4 10*3/uL (ref 0.1–1.0)
Monocytes Relative: 7.4 % (ref 3.0–12.0)
Neutro Abs: 2.5 10*3/uL (ref 1.4–7.7)
Neutrophils Relative %: 48.5 % (ref 43.0–77.0)
Platelets: 237 10*3/uL (ref 150.0–400.0)
RBC: 3.95 Mil/uL (ref 3.87–5.11)
RDW: 17.1 % — ABNORMAL HIGH (ref 11.5–15.5)
WBC: 5.1 10*3/uL (ref 4.0–10.5)

## 2023-04-18 LAB — BASIC METABOLIC PANEL
BUN: 16 mg/dL (ref 6–23)
CO2: 32 mEq/L (ref 19–32)
Calcium: 9.6 mg/dL (ref 8.4–10.5)
Chloride: 100 mEq/L (ref 96–112)
Creatinine, Ser: 1.65 mg/dL — ABNORMAL HIGH (ref 0.40–1.20)
GFR: 34.04 mL/min — ABNORMAL LOW (ref >60.00)
Glucose, Bld: 90 mg/dL (ref 70–99)
Potassium: 4 mEq/L (ref 3.5–5.1)
Sodium: 140 mEq/L (ref 135–145)

## 2023-04-18 LAB — URINALYSIS, ROUTINE W REFLEX MICROSCOPIC
Bilirubin Urine: NEGATIVE
Ketones, ur: NEGATIVE
Nitrite: NEGATIVE
Specific Gravity, Urine: 1.02 (ref 1.000–1.030)
Total Protein, Urine: 30 — AB
Urine Glucose: NEGATIVE
Urobilinogen, UA: 1 (ref 0.0–1.0)
pH: 6.5 (ref 5.0–8.0)

## 2023-04-18 LAB — HEPATIC FUNCTION PANEL
ALT: 37 U/L — ABNORMAL HIGH (ref 0–35)
AST: 52 U/L — ABNORMAL HIGH (ref 0–37)
Albumin: 4.3 g/dL (ref 3.5–5.2)
Alkaline Phosphatase: 43 U/L (ref 39–117)
Bilirubin, Direct: 0.1 mg/dL (ref 0.0–0.3)
Total Bilirubin: 0.5 mg/dL (ref 0.2–1.2)
Total Protein: 8.4 g/dL — ABNORMAL HIGH (ref 6.0–8.3)

## 2023-04-18 LAB — HEMOGLOBIN A1C: Hgb A1c MFr Bld: 6.2 % (ref 4.6–6.5)

## 2023-04-18 LAB — VITAMIN B12: Vitamin B-12: 1162 pg/mL — ABNORMAL HIGH (ref 211–911)

## 2023-04-18 LAB — TSH: TSH: 161.32 u[IU]/mL — ABNORMAL HIGH (ref 0.35–5.50)

## 2023-04-18 LAB — VITAMIN D 25 HYDROXY (VIT D DEFICIENCY, FRACTURES): VITD: 9.53 ng/mL — ABNORMAL LOW (ref 30.00–100.00)

## 2023-04-18 MED ORDER — TRIAMCINOLONE ACETONIDE 55 MCG/ACT NA AERO: 2.0000 | INHALATION_SPRAY | Freq: Every day | NASAL | 12 refills | Status: DC

## 2023-04-18 MED ORDER — ALBUTEROL SULFATE HFA 108 (90 BASE) MCG/ACT IN AERS: 2.0000 | INHALATION_SPRAY | Freq: Four times a day (QID) | RESPIRATORY_TRACT | 5 refills | Status: DC | PRN

## 2023-04-18 MED ORDER — LEVOTHYROXINE SODIUM 125 MCG PO TABS: 250.0000 ug | ORAL_TABLET | Freq: Every day | ORAL | 3 refills | Status: DC

## 2023-04-18 MED ORDER — LISINOPRIL-HYDROCHLOROTHIAZIDE 20-12.5 MG PO TABS: 2.0000 | ORAL_TABLET | Freq: Every day | ORAL | 3 refills | Status: DC

## 2023-04-18 MED ORDER — ROSUVASTATIN CALCIUM 20 MG PO TABS: 20.0000 mg | ORAL_TABLET | Freq: Every day | ORAL | 3 refills | Status: DC

## 2023-04-18 MED ORDER — CETIRIZINE HCL 10 MG PO TABS: 10.0000 mg | ORAL_TABLET | Freq: Every day | ORAL | 11 refills | Status: DC

## 2023-04-18 NOTE — Progress Notes (Unsigned)
Patient ID: Stacy Newton, female   DOB: 05/12/1966, 57 y.o.   MRN: [id number]         Chief Complaint:: wellness exam and Leg Swelling (Says it starts in the back and all the down into her legs and pain )  , lbp, low thyroid, htn       HPI:  Stacy Newton is a 57 y.o. female here for wellness exam; out of meds for several weeks; plans to call for mammogram soon, delcines covid booster, for flu shot and shignrix soon, declines colnoscopy for now; o/w up to date; has appt for eye doctor next wk                        Also Pt denies chest pain, increased sob or doe, wheezing, orthopnea, PND, increased LE swelling, palpitations, dizziness or syncope.   Pt denies polydipsia, polyuria, or new focal neuro s/s.    Pt denies fever, wt loss, night sweats, loss of appetite, or other constitutional symptoms  Denies hyper or hypo thyroid symptoms such as voice, skin or hair change. Pt continues to have recurring LBP without bowel or bladder change, fever, wt loss, gait change or falls but has some radiation to both legs   Wt Readings from Last 3 Encounters:  04/18/23 205 lb (93 kg)  02/19/21 209 lb (94.8 kg)  05/05/19 236 lb (107 kg)   BP Readings from Last 3 Encounters:  04/18/23 (!) 160/94  02/19/21 (!) 188/110  05/06/19 (!) 178/110   Immunization History  Administered Date(s) Administered   Influenza Split 09/06/2011, 06/18/2012   Influenza Whole 05/14/2010   Influenza, Seasonal, Injecte, Preservative Fre 05/26/2014   Influenza,inj,Quad PF,6+ Mos 05/05/2019   PFIZER(Purple Top)SARS-COV-2 Vaccination 10/23/2019, 11/13/2019   Tdap 09/13/2016   Health Maintenance Due  Topic Date Due   Colonoscopy  Never done   Zoster Vaccines- Shingrix (1 of 2) Never done   MAMMOGRAM  01/06/2022   COVID-19 Vaccine (3 - 2023-24 season) 04/26/2022   INFLUENZA VACCINE  03/27/2023      Past Medical History:  Diagnosis Date   Abnormal TSH 09/07/2011   HOT FLASHES 06/25/2010   HYPERLIPIDEMIA  06/25/2010   HYPERTENSION 05/14/2010   THYROMEGALY 05/14/2010   Unspecified hypothyroidism 01/20/2014   Past Surgical History:  Procedure Laterality Date   ABDOMINAL HYSTERECTOMY     CESAREAN SECTION     x2   OOPHORECTOMY     unilateral , left she thinks    reports that she has never smoked. She has never used smokeless tobacco. She reports current alcohol use. She reports that she does not use drugs. family history includes Cancer in her maternal aunt and another family member; Hypertension in her father. No Known Allergies Current Outpatient Medications on File Prior to Visit  Medication Sig Dispense Refill   aspirin 81 MG tablet Take 160 mg by mouth.     Cholecalciferol 50 MCG (2000 UT) TABS 1 tab by mouth once daily 30 tablet 99   No current facility-administered medications on file prior to visit.        ROS:  All others reviewed and negative.  Objective        PE:  BP (!) 160/94 (BP Location: Left Arm, Patient Position: Sitting, Cuff Size: Normal)   Pulse 65   Temp 98.1 F (36.7 C) (Oral)   Ht 5\' 8"  (1.727 m)   Wt 205 lb (93 kg)   SpO2 95%   BMI  31.17 kg/m                 Constitutional: Pt appears in NAD               HENT: Head: NCAT.                Right Ear: External ear normal.                 Left Ear: External ear normal.                Eyes: . Pupils are equal, round, and reactive to light. Conjunctivae and EOM are normal               Nose: without d/c or deformity               Neck: Neck supple. Gross normal ROM               Cardiovascular: Normal rate and regular rhythm.                 Pulmonary/Chest: Effort normal and breath sounds without rales or wheezing.                Abd:  Soft, NT, ND, + BS, no organomegaly               Neurological: Pt is alert. At baseline orientation, motor grossly intact               Skin: Skin is warm. No rashes, no other new lesions, LE edema - trace bialteral               Psychiatric: Pt behavior is normal without  agitation   Micro: none  Cardiac tracings I have personally interpreted today:  none  Pertinent Radiological findings (summarize): none   Lab Results  Component Value Date   WBC 5.1 04/18/2023   HGB 11.1 (L) 04/18/2023   HCT 35.3 (L) 04/18/2023   PLT 237.0 04/18/2023   GLUCOSE 90 04/18/2023   CHOL 345 (H) 04/18/2023   TRIG 171.0 (H) 04/18/2023   HDL 51.40 04/18/2023   LDLDIRECT 222.0 05/05/2019   LDLCALC 259 (H) 04/18/2023   ALT 37 (H) 04/18/2023   AST 52 (H) 04/18/2023   NA 140 04/18/2023   K 4.0 04/18/2023   CL 100 04/18/2023   CREATININE 1.65 (H) 04/18/2023   BUN 16 04/18/2023   CO2 32 04/18/2023   TSH 161.32 (H) 04/18/2023   HGBA1C 6.2 04/18/2023   MICROALBUR 22.7 (H) 04/18/2023   Assessment/Plan:  Stacy Newton is a 57 y.o. Black or African American [2] female with  has a past medical history of Abnormal TSH (09/07/2011), HOT FLASHES (06/25/2010), HYPERLIPIDEMIA (06/25/2010), HYPERTENSION (05/14/2010), THYROMEGALY (05/14/2010), and Unspecified hypothyroidism (01/20/2014).  Encounter for well adult exam with abnormal findings Age and sex appropriate education and counseling updated with regular exercise and diet Referrals for preventative services - for mammogram , decliens colonoscopy Immunizations addressed - declines covid booster, for flu and shingrix soon Smoking counseling  - none needed Evidence for depression or other mood disorder - none significant Most recent labs reviewed. I have personally reviewed and have noted: 1) the patient's medical and social history 2) The patient's current medications and supplements 3) The patient's height, weight, and BMI have been recorded in the chart   Essential hypertension BP Readings from Last 3 Encounters:  04/18/23 (!) 160/94  02/19/21 (!) 188/110  05/06/19 (!) 178/110  Uncontrolled with leg sweling, pt to restart medical treatment zestoretic 20 - 12.5  - 2 qd   Hyperglycemia Lab Results  Component  Value Date   HGBA1C 6.2 04/18/2023   Stable, pt to continue current medical treatment  - diet,w t control   Hyperlipidemia Lab Results  Component Value Date   LDLCALC 259 (H) 04/18/2023   Severe uncontrolled, pt to restart crestor 20 qd   Hypothyroidism Lab Results  Component Value Date   TSH 161.32 (H) 04/18/2023   Severe uncontrolled pt to continue levothyroxine 215 - 2 qd   Vitamin D deficiency Last vitamin D Lab Results  Component Value Date   VD25OH 9.53 (L) 04/18/2023   Low, to start oral replacement  Followup: Return in about 6 months (around 10/19/2023).  Oliver Barre, MD 04/19/2023 8:19 PM [facility] [place] [facility] - [facility] Internal Medicine

## 2023-04-18 NOTE — Patient Instructions (Signed)
Please continue all other medications as before, and refills have been done to restart all medications  Please have the pharmacy call with any other refills you may need.  Please continue your efforts at being more active, low cholesterol diet, and weight control.  You are otherwise up to date with prevention measures today.  Please keep your appointments with your specialists as you may have planned  Please go to the LAB at the blood drawing area for the tests to be done  You will be contacted by phone if any changes need to be made immediately.  Otherwise, you will receive a letter about your results with an explanation, but please check with MyChart first.  Please make an Appointment to return in 6 months, or sooner if needed

## 2023-04-19 ENCOUNTER — Encounter: Payer: Self-pay | Admitting: Internal Medicine

## 2023-04-19 NOTE — Assessment & Plan Note (Signed)
Lab Results  Component Value Date   HGBA1C 6.2 04/18/2023   Stable, pt to continue current medical treatment  - diet,w t control

## 2023-04-19 NOTE — Assessment & Plan Note (Signed)
Age and sex appropriate education and counseling updated with regular exercise and diet Referrals for preventative services - for mammogram , decliens colonoscopy Immunizations addressed - declines covid booster, for flu and shingrix soon Smoking counseling  - none needed Evidence for depression or other mood disorder - none significant Most recent labs reviewed. I have personally reviewed and have noted: 1) the patient's medical and social history 2) The patient's current medications and supplements 3) The patient's height, weight, and BMI have been recorded in the chart

## 2023-04-19 NOTE — Assessment & Plan Note (Signed)
Lab Results  Component Value Date   LDLCALC 259 (H) 04/18/2023   Severe uncontrolled, pt to restart crestor 20 qd

## 2023-04-19 NOTE — Assessment & Plan Note (Signed)
Lab Results  Component Value Date   TSH 161.32 (H) 04/18/2023   Severe uncontrolled pt to continue levothyroxine 215 - 2 qd

## 2023-04-19 NOTE — Assessment & Plan Note (Signed)
Last vitamin D Lab Results  Component Value Date   VD25OH 9.53 (L) 04/18/2023   Low, to start oral replacement

## 2023-04-19 NOTE — Assessment & Plan Note (Signed)
BP Readings from Last 3 Encounters:  04/18/23 (!) 160/94  02/19/21 (!) 188/110  05/06/19 (!) 178/110   Uncontrolled with leg sweling, pt to restart medical treatment zestoretic 20 - 12.5  - 2 qd

## 2023-10-06 ENCOUNTER — Ambulatory Visit: Payer: Self-pay | Admitting: Internal Medicine

## 2023-10-06 NOTE — Telephone Encounter (Signed)
 Copied from CRM [id number]. Topic: Clinical - Medication Question >> Oct 06, 2023 11:30 AM Juvenal Opoka wrote: Reason for CRM: Patient is requesting Dr. Autry Legions go over medication list and send in prescription for any medication that patient would need be filled, specifically blood pressure and thyroid  medication patient is unsure on names. Also just wants doctor Autry Legions to double check no medication is being missed.   Patient would also possibly need a clearance letter to return to work, stopped working due to not feeling like best self and is now needing letter to return.   Patient would like a call back to update regarding both of these request if possible at [phone]

## 2023-10-06 NOTE — Telephone Encounter (Signed)
  Chief Complaint: fell last week and requesting to notify MD. Denies injury  Symptoms: fell in bathroom last week . "Just walking and fell on right hip" . Denies injury . Reports stiffness last week gone now.  Used cane couple of days now not using cane and walking better. Took motrin OTC for pain. No pain now  Frequency: last week Pertinent Negatives: Patient denies injury no pain now no stiffness  Disposition: [] ED /[] Urgent Care (no appt availability in office) / [] Appointment(In office/virtual)/ []  [facility]/ [x] Home Care/ [] Refused Recommended Disposition /[] [facility]/ []  Follow-up with PCP Additional Notes:   Patient requesting to notify PCP of fall last week. Denies pain or injury did not hit head. Requesting which OTC medications to take if pain or stiffness returns. Recommended tylenol or ibuprofen per manufacturer directions. Please advise if appt recommended. Recommended if sx worsen call back .      Copied from CRM [id number]. Topic: Clinical - Red Word Triage >> Oct 06, 2023 11:18 AM Juvenal Opoka wrote: Red Word that prompted transfer to Nurse Triage: Patient fell last week, states having stiffness in legs and arms over past week. Reason for Disposition  [1] Recent fall AND [2] no injury  Answer Assessment - Initial Assessment Questions 1. MECHANISM: "How did the fall happen?"     Walking and fell last week hitting left hip  2. DOMESTIC VIOLENCE AND ELDER ABUSE SCREENING: "Did you fall because someone pushed you or tried to hurt you?" If Yes, ask: "Are you safe now?"     Na   3. ONSET: "When did the fall happen?" (e.g., minutes, hours, or days ago)     Last week 4. LOCATION: "What part of the body hit the ground?" (e.g., back, buttocks, head, hips, knees, hands, head, stomach)     Left hip  5. INJURY: "Did you hurt (injure) yourself when you fell?" If Yes, ask: "What did you injure? Tell me more about this?" (e.g., body area; type of injury; pain  severity)"     Not sure  6. PAIN: "Is there any pain?" If Yes, ask: "How bad is the pain?" (e.g., Scale 1-10; or mild,  moderate, severe)   - NONE (0): No pain   - MILD (1-3): Doesn't interfere with normal activities    - MODERATE (4-7): Interferes with normal activities or awakens from sleep    - SEVERE (8-10): Excruciating pain, unable to do any normal activities      No pain now  7. SIZE: For cuts, bruises, or swelling, ask: "How large is it?" (e.g., inches or centimeters)      None  8. PREGNANCY: "Is there any chance you are pregnant?" "When was your last menstrual period?"     na 9. OTHER SYMPTOMS: "Do you have any other symptoms?" (e.g., dizziness, fever, weakness; new onset or worsening).      Left hip  stiffness better but fell 10. CAUSE: "What do you think caused the fall (or falling)?" (e.g., tripped, dizzy spell)       Just fell walking in bathroom.  Protocols used: Falls and [facility]

## 2023-10-07 MED ORDER — ROSUVASTATIN CALCIUM 20 MG PO TABS: 20.0000 mg | ORAL_TABLET | Freq: Every day | ORAL | 1 refills | Status: DC

## 2023-10-07 MED ORDER — LEVOTHYROXINE SODIUM 125 MCG PO TABS: 250.0000 ug | ORAL_TABLET | Freq: Every day | ORAL | 1 refills | Status: DC

## 2023-10-07 MED ORDER — LISINOPRIL-HYDROCHLOROTHIAZIDE 20-12.5 MG PO TABS: 2.0000 | ORAL_TABLET | Freq: Every day | ORAL | 1 refills | Status: DC

## 2023-10-07 MED ORDER — ALBUTEROL SULFATE HFA 108 (90 BASE) MCG/ACT IN AERS: 2.0000 | INHALATION_SPRAY | Freq: Four times a day (QID) | RESPIRATORY_TRACT | 5 refills | Status: DC | PRN

## 2023-10-07 NOTE — Addendum Note (Signed)
Addended by: Corwin Levins on: 10/07/2023 12:54 PM   Modules accepted: Orders

## 2023-10-07 NOTE — Telephone Encounter (Signed)
Ok refills done erx  [facility] for return to work note - done per [employer]

## 2023-10-07 NOTE — Telephone Encounter (Signed)
See below

## 2023-10-20 ENCOUNTER — Ambulatory Visit: Payer: Self-pay | Admitting: Internal Medicine

## 2023-10-20 ENCOUNTER — Other Ambulatory Visit: Payer: Self-pay | Admitting: Internal Medicine

## 2023-10-20 ENCOUNTER — Encounter: Payer: Self-pay | Admitting: Internal Medicine

## 2023-10-20 VITALS — BP 130/82 | HR 64 | Temp 98.1°F | Ht 68.0 in | Wt 196.0 lb

## 2023-10-20 DIAGNOSIS — E559 Vitamin D deficiency, unspecified: Secondary | ICD-10-CM

## 2023-10-20 DIAGNOSIS — R350 Frequency of micturition: Secondary | ICD-10-CM

## 2023-10-20 DIAGNOSIS — R413 Other amnesia: Secondary | ICD-10-CM

## 2023-10-20 DIAGNOSIS — S46212A Strain of muscle, fascia and tendon of other parts of biceps, left arm, initial encounter: Secondary | ICD-10-CM | POA: Insufficient documentation

## 2023-10-20 LAB — URINALYSIS, ROUTINE W REFLEX MICROSCOPIC
Bilirubin Urine: NEGATIVE
Ketones, ur: NEGATIVE
Nitrite: NEGATIVE
Specific Gravity, Urine: 1.02 (ref 1.000–1.030)
Total Protein, Urine: 30 — AB
Urine Glucose: NEGATIVE
Urobilinogen, UA: 1 (ref 0.0–1.0)
pH: 6.5 (ref 5.0–8.0)

## 2023-10-20 MED ORDER — ALBUTEROL SULFATE HFA 108 (90 BASE) MCG/ACT IN AERS
2.0000 | INHALATION_SPRAY | Freq: Four times a day (QID) | RESPIRATORY_TRACT | 5 refills | Status: AC | PRN
Start: 2023-10-20 — End: ?

## 2023-10-20 MED ORDER — LEVOTHYROXINE SODIUM 125 MCG PO TABS: 250.0000 ug | ORAL_TABLET | Freq: Every day | ORAL | 3 refills | Status: AC

## 2023-10-20 MED ORDER — ROSUVASTATIN CALCIUM 20 MG PO TABS: 20.0000 mg | ORAL_TABLET | Freq: Every day | ORAL | 3 refills | Status: AC

## 2023-10-20 MED ORDER — CEPHALEXIN 500 MG PO CAPS
500.0000 mg | ORAL_CAPSULE | Freq: Three times a day (TID) | ORAL | 0 refills | Status: AC
Start: 2023-10-20 — End: ?

## 2023-10-20 MED ORDER — LISINOPRIL-HYDROCHLOROTHIAZIDE 20-12.5 MG PO TABS: 2.0000 | ORAL_TABLET | Freq: Every day | ORAL | 3 refills | Status: AC

## 2023-10-20 MED ORDER — OXYBUTYNIN CHLORIDE ER 5 MG PO TB24: 5.0000 mg | ORAL_TABLET | Freq: Every day | ORAL | 3 refills | Status: AC

## 2023-10-20 MED ORDER — TRIAMCINOLONE ACETONIDE 55 MCG/ACT NA AERO: 2.0000 | INHALATION_SPRAY | Freq: Every day | NASAL | 12 refills | Status: AC

## 2023-10-20 MED ORDER — CETIRIZINE HCL 10 MG PO TABS: 10.0000 mg | ORAL_TABLET | Freq: Every day | ORAL | 11 refills | Status: AC

## 2023-10-20 NOTE — Progress Notes (Unsigned)
 Patient ID: Mathews Robinsons, female   DOB: Oct 08, 1965, 58 y.o.   MRN: [id number]        Chief Complaint: follow up recent fall x 2 wks, left bicep tendon rupture, OAB, memory loss, low vit d       HPI:  Dynver Clemson Bebeau is a 58 y.o. female here with family, c/o worsening ST memory loss in the past 6 mo, also with urinary frequency but Denies urinary symptoms such as dysuria, frequency, urgency, flank pain, hematuria or n/v, fever, chills.  Also fell x 2 wks ago with left facial bruising now resolved, but has bicep bunching up to left arm since.  Pt denies chest pain, increased sob or doe, wheezing, orthopnea, PND, increased LE swelling, palpitations, dizziness or syncope.   Pt denies polydipsia, polyuria, or new focal neuro s/s.    Pt denies fever, wt loss, night sweats, loss of appetite, or other constitutional symptoms         Wt Readings from Last 3 Encounters:  10/20/23 196 lb (88.9 kg)  04/18/23 205 lb (93 kg)  02/19/21 209 lb (94.8 kg)   BP Readings from Last 3 Encounters:  10/20/23 130/82  04/18/23 (!) 160/94  02/19/21 (!) 188/110         Past Medical History:  Diagnosis Date   Abnormal TSH 09/07/2011   HOT FLASHES 06/25/2010   HYPERLIPIDEMIA 06/25/2010   HYPERTENSION 05/14/2010   THYROMEGALY 05/14/2010   Unspecified hypothyroidism 01/20/2014   Past Surgical History:  Procedure Laterality Date   ABDOMINAL HYSTERECTOMY     CESAREAN SECTION     x2   OOPHORECTOMY     unilateral , left she thinks    reports that she has never smoked. She has never used smokeless tobacco. She reports current alcohol use. She reports that she does not use drugs. family history includes Cancer in her maternal aunt and another family member; Hypertension in her father. No Known Allergies Current Outpatient Medications on File Prior to Visit  Medication Sig Dispense Refill   aspirin 81 MG tablet Take 160 mg by mouth.     Cholecalciferol 50 MCG (2000 UT) TABS 1 tab by mouth once daily 30  tablet 99   No current facility-administered medications on file prior to visit.        ROS:  All others reviewed and negative.  Objective        PE:  BP 130/82 (BP Location: Left Arm, Patient Position: Sitting, Cuff Size: Normal)   Pulse 64   Temp 98.1 F (36.7 C) (Oral)   Ht 5\' 8"  (1.727 m)   Wt 196 lb (88.9 kg)   SpO2 96%   BMI 29.80 kg/m                 Constitutional: Pt appears in NAD               HENT: Head: NCAT.                Right Ear: External ear normal.                 Left Ear: External ear normal.                Eyes: . Pupils are equal, round, and reactive to light. Conjunctivae and EOM are normal               Nose: without d/c or deformity  Neck: Neck supple. Gross normal ROM               Cardiovascular: Normal rate and regular rhythm.                 Pulmonary/Chest: Effort normal and breath sounds without rales or wheezing.                Abd:  Soft, NT, ND, + BS, no organomegaly               Neurological: Pt is alert. At baseline orientation, motor grossly intact               Skin: Skin is warm. No rashes, no other new lesions, LE edema - none; las left lateral distal bicep tendon rupturing               Psychiatric: Pt behavior is normal without agitation   Micro: none  Cardiac tracings I have personally interpreted today:  none  Pertinent Radiological findings (summarize): none   Lab Results  Component Value Date   WBC 5.1 04/18/2023   HGB 11.1 (L) 04/18/2023   HCT 35.3 (L) 04/18/2023   PLT 237.0 04/18/2023   GLUCOSE 90 04/18/2023   CHOL 345 (H) 04/18/2023   TRIG 171.0 (H) 04/18/2023   HDL 51.40 04/18/2023   LDLDIRECT 222.0 05/05/2019   LDLCALC 259 (H) 04/18/2023   ALT 37 (H) 04/18/2023   AST 52 (H) 04/18/2023   NA 140 04/18/2023   K 4.0 04/18/2023   CL 100 04/18/2023   CREATININE 1.65 (H) 04/18/2023   BUN 16 04/18/2023   CO2 32 04/18/2023   TSH 161.32 (H) 04/18/2023   HGBA1C 6.2 04/18/2023   MICROALBUR 22.7 (H)  04/18/2023   Assessment/Plan:  Kali Valencia Qian is a 58 y.o. Black or African American [2] female with  has a past medical history of Abnormal TSH (09/07/2011), HOT FLASHES (06/25/2010), HYPERLIPIDEMIA (06/25/2010), HYPERTENSION (05/14/2010), THYROMEGALY (05/14/2010), and Unspecified hypothyroidism (01/20/2014).  Urinary frequency Likely OAB, for UA r/o infection but also oxybutinin 5 mg qd  Rupture of biceps tendon, left, initial encounter D/w pt - declines ortho for now, but will call if changes her mind soon  Vitamin D deficiency Last vitamin D Lab Results  Component Value Date   VD25OH 9.53 (L) 04/18/2023   Low, to start oral replacement   Memory loss Likely MCI vs early dementia, for neurology referral  Followup: Return in about 6 months (around 04/18/2024).  Oliver Barre, MD 10/22/2023 9:53 PM [facility] [place] [facility] - [facility] Internal Medicine

## 2023-10-20 NOTE — Patient Instructions (Addendum)
 MyChart Username LITTLEVADEN  PW is Jasmine  Please take all new medication as prescribed - the oxybutinin for the bladder  Please continue all other medications as before, and refills have been done if requested.  Please have the pharmacy call with any other refills you may need.  Please continue your efforts at being more active, low cholesterol diet, and weight control.  You are otherwise up to date with prevention measures today.  Please keep your appointments with your specialists as you may have planned  You will be contacted regarding the referral for: neurology and orthopedic  Please go to the LAB at the blood drawing area for the tests to be done - just the urine testing today  You will be contacted by phone if any changes need to be made immediately.  Otherwise, you will receive a letter about your results with an explanation, but please check with MyChart first.  Please make an Appointment to return in 6 months, or sooner if needed

## 2023-10-22 ENCOUNTER — Encounter: Payer: Self-pay | Admitting: Internal Medicine

## 2023-10-22 DIAGNOSIS — R413 Other amnesia: Secondary | ICD-10-CM | POA: Insufficient documentation

## 2023-10-22 NOTE — Assessment & Plan Note (Signed)
 D/w pt - declines ortho for now, but will call if changes her mind soon

## 2023-10-22 NOTE — Assessment & Plan Note (Signed)
 Last vitamin D Lab Results  Component Value Date   VD25OH 9.53 (L) 04/18/2023   Low, to start oral replacement

## 2023-10-22 NOTE — Assessment & Plan Note (Signed)
 Likely OAB, for UA r/o infection but also oxybutinin 5 mg qd

## 2023-10-22 NOTE — Assessment & Plan Note (Signed)
 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
